# Patient Record
Sex: Female | Born: 1980 | Race: White | Hispanic: No | Marital: Married | Smoking: Never smoker
Health system: Southern US, Community
[De-identification: ages and names within clinical notes are randomized; demographics above are authoritative.]

## PROBLEM LIST (undated history)

## (undated) HISTORY — PX: WISDOM TOOTH EXTRACTION: SHX21

---

## 2014-06-01 ENCOUNTER — Encounter: Payer: Self-pay | Admitting: Obstetrics and Gynecology

## 2014-07-03 ENCOUNTER — Inpatient Hospital Stay: Payer: Self-pay

## 2014-07-03 LAB — CBC WITH DIFFERENTIAL/PLATELET
BASOS ABS: 0.1 10*3/uL (ref 0.0–0.1)
BASOS PCT: 0.9 %
Basophil #: 0 10*3/uL (ref 0.0–0.1)
Basophil %: 0.4 %
Eosinophil #: 0.1 10*3/uL (ref 0.0–0.7)
Eosinophil #: 0 10*3/uL (ref 0.0–0.7)
Eosinophil %: 0.7 %
Eosinophil %: 0.4 %
HCT: 33.1 % — AB (ref 35.0–47.0)
HCT: 32.4 % — ABNORMAL LOW (ref 35.0–47.0)
HGB: 10.8 g/dL — AB (ref 12.0–16.0)
HGB: 10.3 g/dL — ABNORMAL LOW (ref 12.0–16.0)
LYMPHS PCT: 17.2 %
Lymphocyte #: 1.1 10*3/uL (ref 1.0–3.6)
Lymphocyte #: 1.4 10*3/uL (ref 1.0–3.6)
Lymphocyte %: 15.2 %
MCH: 28 pg (ref 26.0–34.0)
MCH: 27.6 pg (ref 26.0–34.0)
MCHC: 32.5 g/dL (ref 32.0–36.0)
MCHC: 31.9 g/dL — ABNORMAL LOW (ref 32.0–36.0)
MCV: 86 fL (ref 80–100)
MCV: 86 fL (ref 80–100)
MONOS PCT: 5.2 %
Monocyte #: 0.4 x10 3/mm (ref 0.2–0.9)
Monocyte #: 0.4 x10 3/mm (ref 0.2–0.9)
Monocyte %: 5.8 %
NEUTROS ABS: 6.1 10*3/uL (ref 1.4–6.5)
Neutrophil #: 5.6 10*3/uL (ref 1.4–6.5)
Neutrophil %: 77.9 %
Neutrophil %: 76.3 %
Platelet: 99 10*3/uL — ABNORMAL LOW (ref 150–440)
Platelet: 115 10*3/uL — ABNORMAL LOW (ref 150–440)
RBC: 3.85 10*6/uL (ref 3.80–5.20)
RBC: 3.75 10*6/uL — ABNORMAL LOW (ref 3.80–5.20)
RDW: 13.1 % (ref 11.5–14.5)
RDW: 13.5 % (ref 11.5–14.5)
WBC: 7.2 10*3/uL (ref 3.6–11.0)
WBC: 7.9 10*3/uL (ref 3.6–11.0)

## 2014-07-04 LAB — HEMATOCRIT: HCT: 29 % — ABNORMAL LOW (ref 35.0–47.0)

## 2015-01-20 NOTE — Op Note (Signed)
PATIENT NAME:  Lydia Munoz, Lydia Munoz MR#:  [id number] DATE OF BIRTH:  02-18-1981  DATE OF PROCEDURE:  07/03/2014  PREOPERATIVE DIAGNOSES:  1.  Intrauterine pregnancy at 36 weeks and 6 days. 2.  Preterm premature rupture of membranes. 3.  Preterm labor. 4.  Desire for repeat cesarean section.  5.  Gestational thrombocytopenia, platelets 115,000.  POSTOPERATIVE DIAGNOSES:  1.  Intrauterine pregnancy at 36 weeks and 6 days. 2.  Preterm premature rupture of membranes. 3.  Preterm labor. 4.  Desire for repeat cesarean section.  5.  Gestational thrombocytopenia, platelets 115,000.  PROCEDURE PERFORMED: Repeat low transverse cesarean section via Pfannenstiel skin incision with double layer uterine closure.  SURGEON: Morenci Bing, MD  ASSISTANT: Marta Antu, CNM  ANESTHESIA: Spinal.  INTRAVENOUS FLUIDS: 700 mL Crystalloid.  ESTIMATED BLOOD LOSS: 800 mL.  ANTIBIOTICS: 2 grams Ancef given preoperatively.  URINE OUTPUT: 100 mL via indwelling Foley catheter.  COMPLICATIONS: None.  SPECIMENS: None.  DISPOSITION: Stable to PAC-U.  FINDINGS: No intraabdominal adhesions were noted, although there was some dense scarring of the tissue from the subcutaneous layer down to the rectus fascia, female infant in cephalic presentation with loose nuchal cord x2 (reduced) and clear amniotic fluid. Birth weight 2,790 grams, Apgars 9 and 9, intact placenta with 3 vessel cord. Grossly normal uterus, tubes and ovaries bilaterally.   DESCRIPTION OF PROCEDURE: The patient was taken to the operating room where anesthesia was administered. She was then prepped and draped in normal sterile fashion, in dorsal supine position with a leftward tilt. A Pfannenstiel skin incision was made with the knife and carried down to the underlying rectus fascia and the fascia was then incised in the midline and the incision was extended laterally with the Mayo scissors. Attention was then turned to the superior aspect of the  fascial incision, which was grasped with Kocher clamps x2, tented up and dissected off the rectus muscle. In a similar fashion, the inferior aspect of the rectus fascia was dissected off the rectus muscles with the Mayo scissors. The rectus muscles were then separated in the midline with the help of electrocautery to help with some adhesions in between the rectus muscles and the peritoneum was entered bluntly. The bladder blade was inserted and vesicouterine peritoneum was identified, tented up, and entered with the Metzenbaum scissors. The incision was then extended laterally, and the bladder flap was created digitally, and the bladder blade was then reinserted. A low transverse hysterotomy was made with a scalpel. The endometrial cavity was breached yielding clear amniotic fluid. The incision was then extended bluntly and the infant's head was delivered atraumatically and the nuchal cord easily reduced. The remainder of the body was then delivered atraumatically and the cord was clamped x2 and cut and the infant was handed to the awaiting pediatricians. The placenta was then manually expressed and the uterus was exteriorized and cleared of all clots and debris. The hysterotomy was repaired with a running suture of 1-0 Vicryl and a second imbricating layer of 1-0 Vicryl was then placed for excellent hemostasis. The uterus and adnexa were then returned to the abdomen and hysterotomy was re-inspected. Excellent hemostasis was noted after irrigation of the abdomen. The peritoneum was then reapproximated with 3-0 Vicryl in a simple running fashion and the fascia was reapproximated with 0 Vicryl in simple running fashion. The subcutaneous layer was then closed with interrupted sutures of 3-0 Vicryl and the skin was then closed with 4-0 Vicryl. The patient tolerated the procedure well. Sponge, lap, needle and  instrument counts were correct x2, and the patient was transferred to the recovery room awake, alert, breathing  independently in stable condition.   ____________________________ [place] Bingharlie Jordyan Hardiman, MD cp:sb D: 07/04/2014 07:14:00 ET T: 07/04/2014 08:00:28 ET JOB#: [id number]  cc: [place] Bingharlie Kareena Arrambide, MD, <Dictator> [place] BingHARLIE Onda Kattner MD ELECTRONICALLY SIGNED 07/06/2014 16:07

## 2015-02-06 NOTE — H&P (Signed)
L&D Evaluation:  History Expanded:  HPI 34 yo G3 P1011 with EDD of 08/01/14 per LMP (8 wk US gave EDD of 10/27). Pt presents at 2840w6d with PROM this am. Evaluation at office confirmed rupture with + fern, nitrizine and pooling. Pt reports only mild ctx. Denies VB or decreased FM. [facility], early entry to care, CP cysts seen on US, not seen at repeat US at 31 weeks, growth at that time 51%. H/o LTCS for 2nd stage arrest of 7# fetus in 2012, ectopic pregnancy treated with Methotrexate.   Blood Type (Maternal) A positive   Group B Strep Results Maternal (Result >5wks must be treated as unknown) unknown/result > 5 weeks ago   Maternal HIV Negative   Maternal Syphilis Ab Nonreactive   Maternal Varicella Immune   Rubella Results (Maternal) immune   Maternal T-Dap Unknown   Patient's Medical History No Chronic Illness  prior ectopic   Patient's Surgical History Previous C-Section   Medications Pre Natal Vitamins   Allergies PCN, (rash)   Social History none   ROS:  ROS see HPI   Exam:  Vital Signs stable   General no apparent distress   Mental Status clear   Chest clear   Heart normal sinus rhythm   Abdomen gravid, non-tender   Pelvic 3/75/-2, unchanged from exam 2 hrs prior at office   Mebranes Ruptured   FHT normal rate with no decels, category 1 (questionable variable vs inaccurate tracing d/t pt's position)   Ucx irregular   Impression:  Impression IUP at 6840w6d with PPROM, prior c-section   Plan:  Comments Plan for repeat C-section at approx 1700 tonight (pt last ate at 0900). Keep NPO. Discussed possibility of VBAC with pt, which pt declines.  Start Ancef IV given unknown GBS status.  Dr Vergie LivingPickens aware and in agreement with plan Neonatology has already consulted with pt.   Electronic Signatures: Vella KohlerBrothers, Rockie Schnoor K (CNM)  (Signed 05-Oct-15 12:16)  Authored: L&D Evaluation   Last Updated: 05-Oct-15 12:16 by Vella KohlerBrothers, Malan Werk K (CNM)

## 2016-06-05 ENCOUNTER — Ambulatory Visit (INDEPENDENT_AMBULATORY_CARE_PROVIDER_SITE_OTHER): Payer: BLUE CROSS/BLUE SHIELD | Admitting: Internal Medicine

## 2016-06-05 ENCOUNTER — Encounter: Payer: Self-pay | Admitting: Internal Medicine

## 2016-06-05 VITALS — BP 106/68 | HR 89 | Temp 98.7°F | Wt 139.0 lb

## 2016-06-05 DIAGNOSIS — M7551 Bursitis of right shoulder: Secondary | ICD-10-CM

## 2016-06-05 DIAGNOSIS — J0110 Acute frontal sinusitis, unspecified: Secondary | ICD-10-CM

## 2016-06-05 MED ORDER — CLARITHROMYCIN 500 MG PO TABS: 500.0000 mg | ORAL_TABLET | Freq: Two times a day (BID) | ORAL | 0 refills | Status: DC

## 2016-06-05 NOTE — Patient Instructions (Signed)

## 2016-06-05 NOTE — Progress Notes (Signed)
HPI  Pt presents to the clinic today to establish care. She has not had a PCP in many years, but has been following with GYN.  She reports on 8/21,she went to UC because she broke out in hives. They gave her a steroid shot and started her on Prednisone which she took for 6 days with resolution of her hives. But during that time, she started to develop sinus issues: headache, facial pain and pressure, nasal congestion, post nasal drip and cough. She went back to UC on 05/25/16, they diagnosed her with sinusitis, put her Azithromycin and Flonase. Since that time, she reports she has not felt any better. She has ongoing daily sinus headaches, facial pain and pressure. She is not able to blow anything out of her nose. She denies sore throat. Her cough is non productive. She does not know if she has run fevers, but she reports she feels awful. She has no history of allergies that she is aware of and she has not had sick contacts. She has tried Sudafed in addition to the medications prescribed without any relief.  She also c/o pain her her right shoulder. This started a few days ago. She describes the pain as sharp and stabbing. The pain seems worse when she lays on her shoulder or raises her arm above her head. The pain radiates up her shoulder. She denies numbness, tingling or weakness. She denies any injury to the area. She has not taken anything OTC for this.  Flu: 06/2015 Tetanus: 2015 [facility] Vision screening: as needed Dentist: biannually  History reviewed. No pertinent past medical history.  Current Outpatient Prescriptions  Medication Sig Dispense Refill  . azithromycin (ZITHROMAX) 250 MG tablet Take by mouth daily.    . fluticasone (FLONASE) 50 MCG/ACT nasal spray USE 1-2 SPRAYS IN EACH NOSTRIL EVERY DAY  0  . levonorgestrel (MIRENA) 20 MCG/24HR IUD 1 each by Intrauterine route once. Inserted 2015    . clarithromycin (BIAXIN) 500 MG  tablet Take 1 tablet (500 mg total) by mouth 2 (two) times daily. 20 tablet 0   No current facility-administered medications for this visit.     Allergies  Allergen Reactions  . Penicillins Hives and Diarrhea    Family History  Problem Relation Age of Onset  . Hyperlipidemia Mother   . Colon cancer Maternal Grandmother   . Hyperlipidemia Maternal Grandmother     Social History   Social History  . Marital status: Married    Spouse name: N/A  . Number of children: N/A  . Years of education: N/A   Occupational History  . Not on file.   Social History Main Topics  . Smoking status: Never Smoker  . Smokeless tobacco: Never Used  . Alcohol use Yes     Comment: moderate  . Drug use: Unknown  . Sexual activity: Yes   Other Topics Concern  . Not on file   Social History Narrative  . No narrative on file    ROS:  Constitutional: Pt reports headache. Denies fever, malaise, fatigue, or abrupt weight changes.  HEENT: Pt reports nasal congestion. Denies eye pain, eye redness, ear pain, ringing in the ears, wax buildup, runny nose, bloody nose, or sore throat. Respiratory: Pt reports cough. Denies difficulty breathing, shortness of breath, or sputum production.   Cardiovascular: Denies chest pain, chest tightness, palpitations or swelling in the hands or feet.  Gastrointestinal: Denies abdominal pain, bloating, constipation, diarrhea or blood  in the stool.  GU: Denies frequency, urgency, pain with urination, blood in urine, odor or discharge. Musculoskeletal: Pt reports right shoulder pain. Denies decrease in range of motion, difficulty with gait, muscle pain or joint swelling.  Skin: Denies redness, rashes, lesions or ulcercations.  Neurological: Denies dizziness, difficulty with memory, difficulty with speech or problems with balance and coordination.  Psych: Denies anxiety, depression, SI/HI.  No other specific complaints in a complete review of systems (except as listed  in HPI above).  PE:  BP 106/68   Pulse 89   Temp 98.7 F (37.1 C) (Oral)   Wt 139 lb (63 kg)   LMP  (LMP Unknown) Comment: IUD  SpO2 98%  Wt Readings from Last 3 Encounters:  06/05/16 139 lb (63 kg)    General: Appears her stated age, well developed, well nourished in NAD. HEENT: Head: normal shape and size, frontal sinus tenderness noted; Eyes: sclera white, no icterus, conjunctiva pink, PERRLA and EOMs intact; Ears: Tm's gray and intact, normal light reflex, + serous effusion bilaterally;Throat/Mouth: Teeth present, mucosa pink and moist, no lesions or ulcerations noted.  Neck: No adenopathy noted.  Cardiovascular: Normal rate and rhythm. S1,S2 noted.  No murmur, rubs or gallops noted.  Pulmonary/Chest: Normal effort and positive vesicular breath sounds. No respiratory distress. No wheezes, rales or ronchi noted.  MSK: Normal internal and external rotation of the shoulders. Pain with palpation of the right subacromial bursa. Strength 5/5 BUE. Negative drop can  Test. Neurological: Alert and oriented.  Sensation intact to BUE. Psychiatric: Mood and affect normal. Behavior is normal. Judgment and thought content normal.     Assessment and Plan:  Acute Frontal Sinusitis:  Continue Flonase Stop Erythromycin eRx for Biaxin 500 mg BID x 10 days If symptoms persist, may warrant referral to ENT  Subacromial bursitis, right:  Try 600-800 mg of Ibuprofen every 8 hours prn Ice may be helpful Avoid repetitive motions

## 2016-12-04 ENCOUNTER — Ambulatory Visit: Payer: Self-pay | Admitting: Medical

## 2017-02-10 ENCOUNTER — Telehealth: Payer: Self-pay | Admitting: *Deleted

## 2017-02-10 MED ORDER — OSELTAMIVIR PHOSPHATE 75 MG PO CAPS: 75.0000 mg | ORAL_CAPSULE | Freq: Two times a day (BID) | ORAL | 0 refills | Status: DC

## 2017-02-10 NOTE — Telephone Encounter (Signed)
Patient left a voicemail stating that her husband has been diagnosed with the flu.  Patient stated that she now has developed flu like symptoms and would like Tamiflu sent in. Pharmacy- [facility]

## 2017-02-10 NOTE — Telephone Encounter (Signed)
Tamiflu sent to [facility]

## 2017-02-10 NOTE — Addendum Note (Signed)
Addended by: Lorre MunroeBAITY, Suad Autrey W on: 02/10/2017 12:58 PM   Modules accepted: Orders

## 2017-12-13 DIAGNOSIS — S52502A Unspecified fracture of the lower end of left radius, initial encounter for closed fracture: Secondary | ICD-10-CM | POA: Insufficient documentation

## 2017-12-13 HISTORY — DX: Unspecified fracture of the lower end of left radius, initial encounter for closed fracture: S52.502A

## 2018-01-01 DIAGNOSIS — S52572D Other intraarticular fracture of lower end of left radius, subsequent encounter for closed fracture with routine healing: Secondary | ICD-10-CM | POA: Diagnosis not present

## 2018-01-01 DIAGNOSIS — Y33XXXD Other specified events, undetermined intent, subsequent encounter: Secondary | ICD-10-CM | POA: Diagnosis not present

## 2018-01-01 DIAGNOSIS — S52592D Other fractures of lower end of left radius, subsequent encounter for closed fracture with routine healing: Secondary | ICD-10-CM | POA: Diagnosis not present

## 2018-01-14 ENCOUNTER — Ambulatory Visit: Payer: Self-pay | Admitting: Adult Health

## 2018-01-14 ENCOUNTER — Encounter: Payer: Self-pay | Admitting: Adult Health

## 2018-01-14 VITALS — BP 119/80 | HR 71 | Temp 98.7°F | Resp 16 | Ht 65.0 in | Wt 144.0 lb

## 2018-01-14 DIAGNOSIS — J309 Allergic rhinitis, unspecified: Secondary | ICD-10-CM

## 2018-01-14 DIAGNOSIS — J3489 Other specified disorders of nose and nasal sinuses: Secondary | ICD-10-CM

## 2018-01-14 MED ORDER — AZITHROMYCIN 250 MG PO TABS
ORAL_TABLET | ORAL | 0 refills | Status: DC
Start: 2018-01-14 — End: 2019-08-24

## 2018-01-14 NOTE — Progress Notes (Signed)
Subjective:    Patient ID: Lydia Munoz, female    DOB: Sep 19, 1981, 37 y.o.   MRN: [id number]   Patient is a 37 year old female in no acute distress who reports symptoms as below.   Allergies  Allergen Reactions  . Penicillins Hives and Diarrhea   Patient Active Problem List   Diagnosis Date Noted  . Closed fracture of left distal radius 12/13/2017    Flonase daily - she is using. She is not taking a antihistamine.    Sinusitis  This is a new (2 weeks ) problem. The current episode started 1 to 4 weeks ago. The problem is unchanged. Associated symptoms include congestion, coughing, headaches (intermittent - mild ), sinus pressure (maxillary - mild per patient. ), sneezing, a sore throat and swollen glands. Pertinent negatives include no chills, diaphoresis, ear pain, hoarse voice (two days ago resolved ), neck pain or shortness of breath. Past treatments include oral decongestants (Flonase ). The treatment provided mild relief.      Review of Systems  Constitutional: Negative for chills and diaphoresis.  HENT: Positive for congestion, sinus pressure (maxillary - mild per patient. ), sneezing and sore throat. Negative for dental problem, drooling, ear discharge, ear pain, facial swelling, hearing loss, hoarse voice (two days ago resolved ), mouth sores, nosebleeds, postnasal drip, rhinorrhea, sinus pain, tinnitus and trouble swallowing.   Eyes: Negative.   Respiratory: Positive for cough. Negative for apnea, choking, chest tightness, shortness of breath, wheezing and stridor.   Cardiovascular: Negative.   Gastrointestinal: Negative.   Endocrine: Negative.   Genitourinary: Negative.   Musculoskeletal: Negative for arthralgias, back pain, gait problem, joint swelling, myalgias, neck pain and neck stiffness.  Neurological: Positive for headaches (intermittent - mild ).       Objective:   Physical Exam  Constitutional: She is oriented to person, place, and time. Vital signs are  normal. She appears well-developed and well-nourished. She is active.  Non-toxic appearance. She does not have a sickly appearance. She does not appear ill. No distress.  Patient is alert and oriented and responsive to questions Engages in eye contact with provider. Speaks in full sentences without any pauses without any shortness of breath.   Patient moves on and off of exam table and in room without difficulty. Gait is normal in hall and in room. Patient is oriented to person place time and situation. Patient answers questions appropriately and engages in conversation.   HENT:  Head: Normocephalic and atraumatic.  Right Ear: Hearing, external ear and ear canal normal. Tympanic membrane is not perforated and not erythematous. A middle ear effusion is present.  Left Ear: Hearing, external ear and ear canal normal. Tympanic membrane is not perforated and not erythematous. A middle ear effusion is present.  Nose: Rhinorrhea present. No mucosal edema. Right sinus exhibits no maxillary sinus tenderness and no frontal sinus tenderness. Left sinus exhibits no maxillary sinus tenderness and no frontal sinus tenderness.  Mouth/Throat: Uvula is midline. No uvula swelling. Posterior oropharyngeal erythema (post nasal drip visualized - light yellow in color ) present. No oropharyngeal exudate, posterior oropharyngeal edema or tonsillar abscesses.   Cobblestoning posterior pharynx; bilateral allergic shiners; bilateral TMs air fluid level clear; bilateral nasal turbinates mild edema erythema clear discharge;     Eyes: Pupils are equal, round, and reactive to light. Conjunctivae and EOM are normal. Right eye exhibits no discharge. Left eye exhibits no discharge. No scleral icterus.  Neck: Normal range of motion. Neck supple. No JVD present.  No tracheal deviation present.  Cardiovascular: Normal rate, regular rhythm, normal heart sounds and intact distal pulses. Exam reveals no gallop and no friction rub.  No  murmur heard. Pulmonary/Chest: Effort normal and breath sounds normal. No stridor. No respiratory distress. She has no wheezes. She has no rales. She exhibits no tenderness.  Abdominal: Soft. Bowel sounds are normal.  Musculoskeletal: Normal range of motion.  Lymphadenopathy:       Head (right side): No submental, no submandibular, no tonsillar, no preauricular, no posterior auricular and no occipital adenopathy present.       Head (left side): No submental, no submandibular, no tonsillar, no preauricular, no posterior auricular and no occipital adenopathy present.    She has no cervical adenopathy.  No lymphadenopathy of head or cervical found on exam.   Neurological: She is alert and oriented to person, place, and time. She displays normal reflexes. No cranial nerve deficit. She exhibits normal muscle tone. Coordination normal.  Skin: Skin is warm and dry. No rash noted. She is not diaphoretic. No erythema. No pallor.  Psychiatric: She has a normal mood and affect. Her behavior is normal. Judgment and thought content normal.  Vitals reviewed.       Assessment & Plan:   Meds ordered this encounter  Medications  . azithromycin (ZITHROMAX) 250 MG tablet    Sig: By mouth Take 2 tablets day 1 (500mg  total) and 1 tablet ( 250 mg ) days 2,3,4,5.    Dispense:  6 tablet    Refill:  0   Start antihistamine such as Allegra, Claritin or Zyrtec. Choose one and take per package instructions. Continue Flonase. Discussed allergy symptoms and AVS handout given and reviewed.   Advised patient call the office or your primary care doctor for an appointment if no improvement within 72 hours or if any symptoms change or worsen at any time  Advised ER or urgent Care if after hours or on weekend. Call 911 for emergency symptoms at any time.Patinet verbalized understanding of all instructions given/reviewed and treatment plan and has no further questions or concerns at this time.    Patient verbalized  understanding of all instructions given and denies any further questions at this time.

## 2018-01-14 NOTE — Patient Instructions (Signed)

## 2018-02-03 DIAGNOSIS — Z4789 Encounter for other orthopedic aftercare: Secondary | ICD-10-CM | POA: Diagnosis not present

## 2018-02-03 DIAGNOSIS — Y33XXXD Other specified events, undetermined intent, subsequent encounter: Secondary | ICD-10-CM | POA: Diagnosis not present

## 2018-02-03 DIAGNOSIS — S52612D Displaced fracture of left ulna styloid process, subsequent encounter for closed fracture with routine healing: Secondary | ICD-10-CM | POA: Diagnosis not present

## 2018-02-03 DIAGNOSIS — S52572D Other intraarticular fracture of lower end of left radius, subsequent encounter for closed fracture with routine healing: Secondary | ICD-10-CM | POA: Diagnosis not present

## 2018-02-03 DIAGNOSIS — S52572S Other intraarticular fracture of lower end of left radius, sequela: Secondary | ICD-10-CM | POA: Diagnosis not present

## 2018-02-17 DIAGNOSIS — Z4789 Encounter for other orthopedic aftercare: Secondary | ICD-10-CM | POA: Diagnosis not present

## 2018-02-17 DIAGNOSIS — S52572D Other intraarticular fracture of lower end of left radius, subsequent encounter for closed fracture with routine healing: Secondary | ICD-10-CM | POA: Diagnosis not present

## 2018-02-17 DIAGNOSIS — S52572S Other intraarticular fracture of lower end of left radius, sequela: Secondary | ICD-10-CM | POA: Diagnosis not present

## 2018-03-03 DIAGNOSIS — R208 Other disturbances of skin sensation: Secondary | ICD-10-CM | POA: Diagnosis not present

## 2018-03-03 DIAGNOSIS — D485 Neoplasm of uncertain behavior of skin: Secondary | ICD-10-CM | POA: Diagnosis not present

## 2018-03-03 DIAGNOSIS — D2361 Other benign neoplasm of skin of right upper limb, including shoulder: Secondary | ICD-10-CM | POA: Diagnosis not present

## 2018-03-03 DIAGNOSIS — L538 Other specified erythematous conditions: Secondary | ICD-10-CM | POA: Diagnosis not present

## 2018-03-03 DIAGNOSIS — D2371 Other benign neoplasm of skin of right lower limb, including hip: Secondary | ICD-10-CM | POA: Diagnosis not present

## 2018-03-03 DIAGNOSIS — D2261 Melanocytic nevi of right upper limb, including shoulder: Secondary | ICD-10-CM | POA: Diagnosis not present

## 2018-03-17 DIAGNOSIS — Y33XXXD Other specified events, undetermined intent, subsequent encounter: Secondary | ICD-10-CM | POA: Diagnosis not present

## 2018-03-17 DIAGNOSIS — S52572D Other intraarticular fracture of lower end of left radius, subsequent encounter for closed fracture with routine healing: Secondary | ICD-10-CM | POA: Diagnosis not present

## 2018-06-18 DIAGNOSIS — S52572D Other intraarticular fracture of lower end of left radius, subsequent encounter for closed fracture with routine healing: Secondary | ICD-10-CM | POA: Diagnosis not present

## 2018-06-18 DIAGNOSIS — Y33XXXD Other specified events, undetermined intent, subsequent encounter: Secondary | ICD-10-CM | POA: Diagnosis not present

## 2019-06-30 ENCOUNTER — Telehealth: Payer: Self-pay | Admitting: Obstetrics and Gynecology

## 2019-06-30 ENCOUNTER — Other Ambulatory Visit: Payer: Self-pay

## 2019-06-30 ENCOUNTER — Ambulatory Visit: Payer: Self-pay

## 2019-06-30 DIAGNOSIS — Z23 Encounter for immunization: Secondary | ICD-10-CM

## 2019-06-30 NOTE — Telephone Encounter (Signed)
Patient is schedule for Mirena replacement / ANNUAL 07/19/19 with [facility]

## 2019-07-01 NOTE — Telephone Encounter (Signed)
Noted. Will order to arrive by apt date/time. 

## 2019-07-19 ENCOUNTER — Other Ambulatory Visit (HOSPITAL_COMMUNITY)
Admission: RE | Admit: 2019-07-19 | Discharge: 2019-07-19 | Disposition: A | Discharge status: Home / Self Care | Payer: BLUE CROSS/BLUE SHIELD | Source: Ambulatory Visit | Attending: Obstetrics and Gynecology | Admitting: Obstetrics and Gynecology

## 2019-07-19 ENCOUNTER — Other Ambulatory Visit: Payer: Self-pay

## 2019-07-19 ENCOUNTER — Encounter: Payer: Self-pay | Admitting: Obstetrics and Gynecology

## 2019-07-19 ENCOUNTER — Ambulatory Visit (INDEPENDENT_AMBULATORY_CARE_PROVIDER_SITE_OTHER): Payer: BC Managed Care – PPO | Admitting: Obstetrics and Gynecology

## 2019-07-19 VITALS — BP 120/80 | Ht 65.0 in | Wt 153.0 lb

## 2019-07-19 DIAGNOSIS — Z131 Encounter for screening for diabetes mellitus: Secondary | ICD-10-CM

## 2019-07-19 DIAGNOSIS — Z130 Encounter for screening for diseases of the blood and blood-forming organs and certain disorders involving the immune mechanism: Secondary | ICD-10-CM

## 2019-07-19 DIAGNOSIS — Z124 Encounter for screening for malignant neoplasm of cervix: Secondary | ICD-10-CM | POA: Insufficient documentation

## 2019-07-19 DIAGNOSIS — Z01419 Encounter for gynecological examination (general) (routine) without abnormal findings: Secondary | ICD-10-CM

## 2019-07-19 DIAGNOSIS — Z30433 Encounter for removal and reinsertion of intrauterine contraceptive device: Secondary | ICD-10-CM | POA: Diagnosis not present

## 2019-07-19 DIAGNOSIS — Z13220 Encounter for screening for lipoid disorders: Secondary | ICD-10-CM

## 2019-07-19 DIAGNOSIS — Z1329 Encounter for screening for other suspected endocrine disorder: Secondary | ICD-10-CM

## 2019-07-19 DIAGNOSIS — Z0000 Encounter for general adult medical examination without abnormal findings: Secondary | ICD-10-CM

## 2019-07-19 MED ORDER — LEVONORGESTREL 20 MCG/24HR IU IUD: INTRAUTERINE_SYSTEM | Freq: Once | INTRAUTERINE | Status: AC

## 2019-07-19 NOTE — Progress Notes (Signed)
Gynecology Annual Exam   PCP: Lorre Munroe, NP  Chief Complaint:  Chief Complaint  Patient presents with  . Gynecologic Exam  . Contraception    History of Present Illness: Patient is a 38 y.o. [id number] presents for annual exam. The patient has no complaints today.   LMP: No LMP recorded. (Menstrual status: IUD). She does not have uterine bleeding with the IUD in place.  The patient is sexually active. She currently uses IUD for contraception. She denies dyspareunia.  The patient does not perform self breast exams.  There is no notable family history of breast or ovarian cancer in her family.  The patient has regular exercise: yes.    The patient denies current symptoms of depression.    Review of Systems: ROS  Past Medical History:  History reviewed. No pertinent past medical history.  Past Surgical History:  History reviewed. No pertinent surgical history.  Gynecologic History:  No LMP recorded. (Menstrual status: IUD). Contraception: IUD Last Pap: Results were: unknonw   Obstetric History: [id number]  Family History:  Family History  Problem Relation Age of Onset  . Hyperlipidemia Mother   . Colon cancer Maternal Grandmother   . Hyperlipidemia Maternal Grandmother     Social History:  Social History   Socioeconomic History  . Marital status: Married    Spouse name: Not on file  . Number of children: Not on file  . Years of education: Not on file  . Highest education level: Not on file  Occupational History  . Not on file  Social Needs  . Financial resource strain: Not on file  . Food insecurity    Worry: Not on file    Inability: Not on file  . Transportation needs    Medical: Not on file    Non-medical: Not on file  Tobacco Use  . Smoking status: Never Smoker  . Smokeless tobacco: Never Used  Substance and Sexual Activity  . Alcohol use: Yes    Comment: moderate  . Drug use: Not on file  . Sexual activity: Yes  Lifestyle  . Physical  activity    Days per week: Not on file    Minutes per session: Not on file  . Stress: Not on file  Relationships  . Social Musician on phone: Not on file    Gets together: Not on file    Attends religious service: Not on file    Active member of club or organization: Not on file    Attends meetings of clubs or organizations: Not on file    Relationship status: Not on file  . Intimate partner violence    Fear of current or ex partner: Not on file    Emotionally abused: Not on file    Physically abused: Not on file    Forced sexual activity: Not on file  Other Topics Concern  . Not on file  Social History Narrative  . Not on file    Allergies:  Allergies  Allergen Reactions  . Penicillins Hives and Diarrhea    Medications: Prior to Admission medications   Medication Sig Start Date End Date Taking? Authorizing Provider  levonorgestrel (MIRENA) 20 MCG/24HR IUD 1 each by Intrauterine route once. Inserted 2015   Yes [provider]  azithromycin (ZITHROMAX) 250 MG tablet By mouth Take 2 tablets day 1 (500mg  total) and 1 tablet ( 250 mg ) days 2,3,4,5. Patient not taking: Reported on 07/19/2019 01/14/18   Flinchum,  Kelby Aline, FNP  fluticasone (FLONASE) 50 MCG/ACT nasal spray USE 1-2 SPRAYS IN EACH NOSTRIL EVERY DAY 05/29/16   [provider]    Physical Exam Vitals: Blood pressure 120/80, height 5\' 5"  (1.651 m), weight 153 lb (69.4 kg).  General: NAD HEENT: normocephalic, anicteric Thyroid: no enlargement, no palpable nodules Pulmonary: No increased work of breathing, CTAB Cardiovascular: RRR, distal pulses 2+ Breast: Breast symmetrical, no tenderness, no palpable nodules or masses, no skin or nipple retraction present, no nipple discharge.  No axillary or supraclavicular lymphadenopathy. Abdomen: NABS, soft, non-tender, non-distended.  Umbilicus without lesions.  No hepatomegaly, splenomegaly or masses palpable. No evidence of hernia   Genitourinary:  External: Normal external female genitalia.  Normal urethral meatus, normal Bartholin's and Skene's glands.    Vagina: Normal vaginal mucosa, no evidence of prolapse.    Cervix: Grossly normal in appearance, no bleeding  Uterus: Non-enlarged, mobile, normal contour.  No CMT  Adnexa: ovaries non-enlarged, no adnexal masses  Rectal: deferred  Lymphatic: no evidence of inguinal lymphadenopathy Extremities: no edema, erythema, or tenderness Neurologic: Grossly intact Psychiatric: mood appropriate, affect full  Female chaperone present for pelvic and breast  portions of the physical exam   IUD placed approximately 5 years ago. Since that time, she states that she has been happy with the IUD. She wishes to have her IUD removed and replaced with a new  IUD.   Discussed risks of irregular bleeding, cramping, infection, malpositioning or misplacement of the IUD outside the uterus which may require further procedure such as laparoscopy, risk of failure <1%. Time out was performed.   Patient identified, informed consent performed, consent signed.     A bimanual exam showed the uterus to be retroverted.  Speculum placed in the vagina.  Cervix visualized.    IUD Removal Strings of IUD identified and grasped.  IUD removed without problem.  Pt tolerated this well.  IUD noted to be intact.  IUD Placement Cleaned with Betadine x 2.  Grasped anteriorly with a single tooth tenaculum.  Uterus sounded to 7 cm.   IUD placed per manufacturer's recommendations. Difficult placement, bedside US used to correctly position the IUD.  Strings trimmed to 3 cm. Tenaculum was removed, good hemostasis noted.  Patient tolerated procedure well.   Patient was given post-procedure instructions. Patient was also asked to check IUD strings periodically and follow up in 4 weeks for IUD check.  Assessment: 38 y.o. [id number] routine annual exam  Plan: Problem List Items Addressed This Visit    None    Visit  Diagnoses    Cervical cancer screening    -  Primary   Relevant Orders   Cytology - PAP   Encounter for IUD removal and reinsertion       Relevant Medications   levonorgestrel (MIRENA) 20 MCG/24HR IUD      2) STI screening  was offered and declined  2)  ASCCP guidelines and rational discussed.  Patient opts for every 5 years screening interval  3) Contraception - the patient is currently using  IUD.  She is happy with her current form of contraception and plans to continue  4) Routine healthcare maintenance including cholesterol, diabetes screening discussed To return fasting at a later date  5) Return in about 4 weeks (around 08/16/2019) for IUD check.  Adrian Prows MD [facility], [facility] 07/19/2019 5:03 PM

## 2019-07-21 LAB — CYTOLOGY - PAP
Comment: NEGATIVE
Diagnosis: NEGATIVE
High risk HPV: NEGATIVE

## 2019-07-21 NOTE — Progress Notes (Signed)
NIL- released to mychart with note

## 2019-08-24 ENCOUNTER — Encounter: Payer: Self-pay | Admitting: Obstetrics and Gynecology

## 2019-08-24 ENCOUNTER — Other Ambulatory Visit: Payer: Self-pay

## 2019-08-24 ENCOUNTER — Ambulatory Visit (INDEPENDENT_AMBULATORY_CARE_PROVIDER_SITE_OTHER): Payer: BC Managed Care – PPO | Admitting: Obstetrics and Gynecology

## 2019-08-24 VITALS — BP 120/70 | Temp 97.5°F | Ht 65.0 in | Wt 155.0 lb

## 2019-08-24 DIAGNOSIS — Z30431 Encounter for routine checking of intrauterine contraceptive device: Secondary | ICD-10-CM

## 2019-08-24 NOTE — Progress Notes (Signed)
  History of Present Illness:  Lydia Munoz is a 38 y.o. that had a Mirena IUD placed approximately 1 month ago. Since that time, she states that she has had no pain  PMHx: She  has no past medical history on file. Also,  has no past surgical history on file., family history includes Colon cancer in her maternal grandmother; Hyperlipidemia in her maternal grandmother and mother.,  reports that she has never smoked. She has never used smokeless tobacco. She reports current alcohol use. Current Meds  Medication Sig  . fluticasone (FLONASE) 50 MCG/ACT nasal spray USE 1-2 SPRAYS IN EACH NOSTRIL EVERY DAY  . levonorgestrel (MIRENA) 20 MCG/24HR IUD 1 each by Intrauterine route once. Inserted 2015   Current Facility-Administered Medications for the 08/24/19 encounter (Office Visit) with Homero Fellers, MD  Medication  . levonorgestrel (MIRENA) 20 MCG/24HR IUD  .  Also, is allergic to penicillins..  Review of Systems  Constitutional: Negative for chills, fever, malaise/fatigue and weight loss.  HENT: Negative for congestion, hearing loss and sinus pain.   Eyes: Negative for blurred vision and double vision.  Respiratory: Negative for cough, sputum production, shortness of breath and wheezing.   Cardiovascular: Negative for chest pain, palpitations, orthopnea and leg swelling.  Gastrointestinal: Negative for abdominal pain, constipation, diarrhea, nausea and vomiting.  Genitourinary: Negative for dysuria, flank pain, frequency, hematuria and urgency.  Musculoskeletal: Negative for back pain, falls and joint pain.  Skin: Negative for itching and rash.  Neurological: Negative for dizziness and headaches.  Psychiatric/Behavioral: Negative for depression, substance abuse and suicidal ideas. The patient is not nervous/anxious.     Physical Exam:  BP 120/70   Temp (!) 97.5 F (36.4 C)   Ht 5\' 5"  (1.651 m)   Wt 155 lb (70.3 kg)   BMI 25.79 kg/m  Body mass index is 25.79 kg/m.  Constitutional: Well nourished, well developed female in no acute distress.  Abdomen: diffusely non tender to palpation, non distended, and no masses, hernias Neuro: Grossly intact Psych:  Normal mood and affect.    Pelvic exam:  Two IUD strings present seen coming from the cervical os. EGBUS, vaginal vault and cervix: within normal limits  Assessment: IUD strings present in proper location; pt doing well  Plan: She was told to continue to use barrier contraception, in order to prevent any STIs, and to take a home pregnancy test or call us if she ever thinks she may be pregnant, and that her IUD expires in 6 years.  She was amenable to this plan and we will see her back in 1 year/PRN.  A total of 10 minutes were spent face-to-face with the patient during this encounter and over half of that time dealt with counseling and coordination of care.  Adrian Prows MD [facility], [facility] 08/24/2019 2:34 PM

## 2019-08-30 ENCOUNTER — Other Ambulatory Visit: Payer: Self-pay

## 2019-08-30 ENCOUNTER — Other Ambulatory Visit: Payer: BC Managed Care – PPO

## 2019-08-30 DIAGNOSIS — Z1329 Encounter for screening for other suspected endocrine disorder: Secondary | ICD-10-CM | POA: Diagnosis not present

## 2019-08-30 DIAGNOSIS — Z131 Encounter for screening for diabetes mellitus: Secondary | ICD-10-CM | POA: Diagnosis not present

## 2019-08-30 DIAGNOSIS — Z13220 Encounter for screening for lipoid disorders: Secondary | ICD-10-CM | POA: Diagnosis not present

## 2019-08-30 DIAGNOSIS — Z130 Encounter for screening for diseases of the blood and blood-forming organs and certain disorders involving the immune mechanism: Secondary | ICD-10-CM

## 2019-08-30 DIAGNOSIS — Z0000 Encounter for general adult medical examination without abnormal findings: Secondary | ICD-10-CM | POA: Diagnosis not present

## 2019-08-31 ENCOUNTER — Other Ambulatory Visit: Payer: Self-pay

## 2019-08-31 DIAGNOSIS — Z20822 Contact with and (suspected) exposure to covid-19: Secondary | ICD-10-CM

## 2019-08-31 LAB — T4, FREE: Free T4: 1.06 ng/dL (ref 0.82–1.77)

## 2019-08-31 LAB — BASIC METABOLIC PANEL
BUN/Creatinine Ratio: 12 (ref 9–23)
BUN: 10 mg/dL (ref 6–20)
CO2: 21 mmol/L (ref 20–29)
Calcium: 8.8 mg/dL (ref 8.7–10.2)
Chloride: 105 mmol/L (ref 96–106)
Creatinine, Ser: 0.82 mg/dL (ref 0.57–1.00)
GFR calc Af Amer: 105 mL/min/{1.73_m2} (ref >59)
GFR calc non Af Amer: 91 mL/min/{1.73_m2} (ref >59)
Glucose: 86 mg/dL (ref 65–99)
Potassium: 4.2 mmol/L (ref 3.5–5.2)
Sodium: 142 mmol/L (ref 134–144)

## 2019-08-31 LAB — CBC WITH DIFFERENTIAL
Basophils Absolute: 0.1 10*3/uL (ref 0.0–0.2)
Basos: 1 %
EOS (ABSOLUTE): 0.1 10*3/uL (ref 0.0–0.4)
Eos: 2 %
Hematocrit: 38.6 % (ref 34.0–46.6)
Hemoglobin: 13.5 g/dL (ref 11.1–15.9)
Immature Grans (Abs): 0 10*3/uL (ref 0.0–0.1)
Immature Granulocytes: 0 %
Lymphocytes Absolute: 1.3 10*3/uL (ref 0.7–3.1)
Lymphs: 35 %
MCH: 31.9 pg (ref 26.6–33.0)
MCHC: 35 g/dL (ref 31.5–35.7)
MCV: 91 fL (ref 79–97)
Monocytes Absolute: 0.2 10*3/uL (ref 0.1–0.9)
Monocytes: 6 %
Neutrophils Absolute: 2 10*3/uL (ref 1.4–7.0)
Neutrophils: 56 %
RBC: 4.23 x10E6/uL (ref 3.77–5.28)
RDW: 12.5 % (ref 11.7–15.4)
WBC: 3.7 10*3/uL (ref 3.4–10.8)

## 2019-08-31 LAB — LIPID PANEL
Chol/HDL Ratio: 2.6 ratio (ref 0.0–4.4)
Cholesterol, Total: 165 mg/dL (ref 100–199)
HDL: 64 mg/dL (ref >39)
LDL Chol Calc (NIH): 85 mg/dL (ref 0–99)
Triglycerides: 85 mg/dL (ref 0–149)
VLDL Cholesterol Cal: 16 mg/dL (ref 5–40)

## 2019-08-31 LAB — TSH: TSH: 2.23 u[IU]/mL (ref 0.450–4.500)

## 2019-08-31 NOTE — Progress Notes (Signed)
WNL

## 2019-09-02 LAB — NOVEL CORONAVIRUS, NAA: SARS-CoV-2, NAA: NOT DETECTED

## 2019-09-29 ENCOUNTER — Ambulatory Visit (INDEPENDENT_AMBULATORY_CARE_PROVIDER_SITE_OTHER)
Admission: RE | Admit: 2019-09-29 | Discharge: 2019-09-29 | Disposition: A | Discharge status: Home / Self Care | Payer: BC Managed Care – PPO | Source: Ambulatory Visit

## 2019-09-29 DIAGNOSIS — R21 Rash and other nonspecific skin eruption: Secondary | ICD-10-CM

## 2019-09-29 MED ORDER — PREDNISONE 10 MG (21) PO TBPK: ORAL_TABLET | Freq: Every day | ORAL | 0 refills | Status: DC

## 2019-09-29 NOTE — Discharge Instructions (Signed)
Take the prednisone as prescribed.  Additionally you can take Benadryl as needed for itching.    Follow-up with your primary care provider or come here to be seen in person if your symptoms are not improving.

## 2019-09-29 NOTE — ED Provider Notes (Signed)
Virtual Visit via Video Note:  Lydia Munoz  initiated request for Telemedicine visit with [facility]gent [facility] team. I connected with Lydia Munoz  on 09/29/2019 at 12:58 PM  for a synchronized telemedicine visit using a video enabled HIPPA compliant telemedicine application. I verified that I am speaking with Lydia Munoz  using two identifiers. Sharion Balloon, NP  was physically located in a [facility] Urgent care site and Shellee Streng was located at a different location.   The limitations of evaluation and management by telemedicine as well as the availability of in-person appointments were discussed. Patient was informed that she  may incur a bill ( including co-pay) for this virtual visit encounter. Lydia Munoz  expressed understanding and gave verbal consent to proceed with virtual visit.     History of Present Illness:Lydia Munoz  is a 38 y.o. female presents for evaluation of red, pruritic, nontender rash on abdomen x 5 days.  The rash is only on left side; she describes it as raised red bumps; no pustules or blisters. No immunosuppressive risk factors.  Treating at home with hydrocortisone cream.  No new products or medicines.  Denies fever, chills, sore throat, cough, shortness of breath, vomiting, diarrhea, or other symptoms.     Allergies  Allergen Reactions  . Penicillins Hives and Diarrhea     History reviewed. No pertinent past medical history.   Social History   Tobacco Use  . Smoking status: Never Smoker  . Smokeless tobacco: Never Used  Substance Use Topics  . Alcohol use: Yes    Comment: moderate  . Drug use: Not on file        Observations/Objective: Physical Exam  VITALS: Patient denies fever. GENERAL: Alert, appears well and in no acute distress. HEENT: Atraumatic. NECK: Normal movements of the head and neck. CARDIOPULMONARY: No increased WOB. Speaking in clear sentences. I:E ratio WNL.  MS: Moves all visible extremities without noticeable  abnormality. PSYCH: Pleasant and cooperative, well-groomed. Speech normal rate and rhythm. Affect is appropriate. Insight and judgement are appropriate. Attention is focused, linear, and appropriate.  NEURO: CN grossly intact. Oriented as arrived to appointment on time with no prompting. Moves both UE equally.  SKIN: Left lower abdomen has two clusters of red papules.  No apparent drainage.     Assessment and Plan:    ICD-10-CM   1. Rash and nonspecific skin eruption  R21        Follow Up Instructions: Rash is not consistent with shingles.  Treating with prednisone and Benadryl.  Instructed patient to follow-up with her PCP or come here to be seen in person if her symptoms are not improving.    I discussed the assessment and treatment plan with the patient. The patient was provided an opportunity to ask questions and all were answered. The patient agreed with the plan and demonstrated an understanding of the instructions.   The patient was advised to call back or seek an in-person evaluation if the symptoms worsen or if the condition fails to improve as anticipated.      Sharion Balloon, NP  09/29/2019 12:58 PM         Sharion Balloon, NP 09/29/19 [id number]

## 2019-12-02 ENCOUNTER — Ambulatory Visit: Payer: Self-pay | Attending: Internal Medicine

## 2019-12-02 DIAGNOSIS — Z23 Encounter for immunization: Secondary | ICD-10-CM | POA: Insufficient documentation

## 2019-12-02 NOTE — Progress Notes (Signed)
   Covid-19 Vaccination Clinic  Name:  Lydia Munoz    MRN: [id number] DOB: [birth year]  12/02/2019  Lydia Munoz was observed post Covid-19 immunization for 15 minutes without incident. She was provided with Vaccine Information Sheet and instruction to access the V-Safe system.   Lydia Munoz was instructed to call 911 with any severe reactions post vaccine: Marland Kitchen Difficulty breathing  . Swelling of face and throat  . A fast heartbeat  . A bad rash all over body  . Dizziness and weakness

## 2019-12-26 ENCOUNTER — Ambulatory Visit: Payer: Self-pay | Attending: Internal Medicine

## 2019-12-26 DIAGNOSIS — Z23 Encounter for immunization: Secondary | ICD-10-CM

## 2019-12-26 NOTE — Progress Notes (Signed)
   Covid-19 Vaccination Clinic  Name:  Lydia Munoz    MRN: [id number] DOB: Jun 21, 1981  12/26/2019  [name] was observed post Covid-19 immunization for 15 minutes without incident. She was provided with Vaccine Information Sheet and instruction to access the V-Safe system.   [name] was instructed to call 911 with any severe reactions post vaccine: Marland Kitchen Difficulty breathing  . Swelling of face and throat  . A fast heartbeat  . A bad rash all over body  . Dizziness and weakness   Immunizations Administered    Name Date Dose VIS Date Route   Pfizer COVID-19 Vaccine 12/26/2019 12:17 PM 0.3 mL 09/09/2019 Intramuscular   Manufacturer: [employer], [employer]   Lot: [id number]   NDC: [address]-2

## 2020-01-02 ENCOUNTER — Ambulatory Visit: Payer: Self-pay

## 2020-03-07 DIAGNOSIS — D2361 Other benign neoplasm of skin of right upper limb, including shoulder: Secondary | ICD-10-CM | POA: Diagnosis not present

## 2020-03-07 DIAGNOSIS — D235 Other benign neoplasm of skin of trunk: Secondary | ICD-10-CM | POA: Diagnosis not present

## 2020-03-07 DIAGNOSIS — L728 Other follicular cysts of the skin and subcutaneous tissue: Secondary | ICD-10-CM | POA: Diagnosis not present

## 2020-03-07 DIAGNOSIS — L918 Other hypertrophic disorders of the skin: Secondary | ICD-10-CM | POA: Diagnosis not present

## 2020-10-13 ENCOUNTER — Telehealth: Payer: Self-pay | Admitting: Nurse Practitioner

## 2020-10-13 DIAGNOSIS — J029 Acute pharyngitis, unspecified: Secondary | ICD-10-CM

## 2020-10-13 NOTE — Progress Notes (Signed)

## 2020-10-28 ENCOUNTER — Telehealth: Payer: BC Managed Care – PPO | Admitting: Oncology

## 2020-10-28 DIAGNOSIS — U071 COVID-19: Secondary | ICD-10-CM

## 2020-10-28 DIAGNOSIS — R059 Cough, unspecified: Secondary | ICD-10-CM

## 2020-10-28 MED ORDER — HYDROCOD POLST-CPM POLST ER 10-8 MG/5ML PO SUER: 5.0000 mL | Freq: Two times a day (BID) | ORAL | 0 refills | Status: DC

## 2020-10-28 MED ORDER — PREDNISONE 50 MG PO TABS: ORAL_TABLET | ORAL | 0 refills | Status: DC

## 2020-10-28 MED ORDER — AZITHROMYCIN 250 MG PO TABS: ORAL_TABLET | ORAL | 0 refills | Status: DC

## 2020-10-28 NOTE — Patient Instructions (Signed)
Person Under Monitoring Name: Lydia Munoz  Location: [address] [place] [address]   Infection Prevention Recommendations for Individuals Confirmed to have, or Being Evaluated for, 2019 Novel Coronavirus (COVID-19) Infection Who Receive Care at Home  Individuals who are confirmed to have, or are being evaluated for, COVID-19 should follow the prevention steps below until a healthcare provider or local or state health department says they can return to normal activities.  Stay home except to get medical care You should restrict activities outside your home, except for getting medical care. Do not go to work, school, or public areas, and do not use public transportation or taxis.  Call ahead before visiting your doctor Before your medical appointment, call the healthcare provider and tell them that you have, or are being evaluated for, COVID-19 infection. This will help the healthcare provider's office take steps to keep other people from getting infected. Ask your healthcare provider to call the local or state health department.  Monitor your symptoms Seek prompt medical attention if your illness is worsening (e.g., difficulty breathing). Before going to your medical appointment, call the healthcare provider and tell them that you have, or are being evaluated for, COVID-19 infection. Ask your healthcare provider to call the local or state health department.  Wear a facemask You should wear a facemask that covers your nose and mouth when you are in the same room with other people and when you visit a healthcare provider. People who live with or visit you should also wear a facemask while they are in the same room with you.  Separate yourself from other people in your home As much as possible, you should stay in a different room from other people in your home. Also, you should use a separate bathroom, if available.  Avoid sharing household items You should not share  dishes, drinking glasses, cups, eating utensils, towels, bedding, or other items with other people in your home. After using these items, you should wash them thoroughly with soap and water.  Cover your coughs and sneezes Cover your mouth and nose with a tissue when you cough or sneeze, or you can cough or sneeze into your sleeve. Throw used tissues in a lined trash can, and immediately wash your hands with soap and water for at least 20 seconds or use an alcohol-based hand rub.  Wash your [employer] your hands often and thoroughly with soap and water for at least 20 seconds. You can use an alcohol-based hand sanitizer if soap and water are not available and if your hands are not visibly dirty. Avoid touching your eyes, nose, and mouth with unwashed hands.   Prevention Steps for Caregivers and Household Members of Individuals Confirmed to have, or Being Evaluated for, COVID-19 Infection Being Cared for in the Home  If you live with, or provide care at home for, a person confirmed to have, or being evaluated for, COVID-19 infection please follow these guidelines to prevent infection:  Follow healthcare provider's instructions Make sure that you understand and can help the patient follow any healthcare provider instructions for all care.  Provide for the patient's basic needs You should help the patient with basic needs in the home and provide support for getting groceries, prescriptions, and other personal needs.  Monitor the patient's symptoms If they are getting sicker, call his or her medical provider and tell them that the patient has, or is being evaluated for, COVID-19 infection. This will help the healthcare provider's office  take steps to keep other people from getting infected. Ask the healthcare provider to call the local or state health department.  Limit the number of people who have contact with the patient  If possible, have only one caregiver for the patient.  Other  household members should stay in another home or place of residence. If this is not possible, they should stay  in another room, or be separated from the patient as much as possible. Use a separate bathroom, if available.  Restrict visitors who do not have an essential need to be in the home.  Keep older adults, very young children, and other sick people away from the patient Keep older adults, very young children, and those who have compromised immune systems or chronic health conditions away from the patient. This includes people with chronic heart, lung, or kidney conditions, diabetes, and cancer.  Ensure good ventilation Make sure that shared spaces in the home have good air flow, such as from an air conditioner or an opened window, weather permitting.  Wash your hands often  Wash your hands often and thoroughly with soap and water for at least 20 seconds. You can use an alcohol based hand sanitizer if soap and water are not available and if your hands are not visibly dirty.  Avoid touching your eyes, nose, and mouth with unwashed hands.  Use disposable paper towels to dry your hands. If not available, use dedicated cloth towels and replace them when they become wet.  Wear a facemask and gloves  Wear a disposable facemask at all times in the room and gloves when you touch or have contact with the patient's blood, body fluids, and/or secretions or excretions, such as sweat, saliva, sputum, nasal mucus, vomit, urine, or feces.  Ensure the mask fits over your nose and mouth tightly, and do not touch it during use.  Throw out disposable facemasks and gloves after using them. Do not reuse.  Wash your hands immediately after removing your facemask and gloves.  If your personal clothing becomes contaminated, carefully remove clothing and launder. Wash your hands after handling contaminated clothing.  Place all used disposable facemasks, gloves, and other waste in a lined container before  disposing them with other household waste.  Remove gloves and wash your hands immediately after handling these items.  Do not share dishes, glasses, or other household items with the patient  Avoid sharing household items. You should not share dishes, drinking glasses, cups, eating utensils, towels, bedding, or other items with a patient who is confirmed to have, or being evaluated for, COVID-19 infection.  After the person uses these items, you should wash them thoroughly with soap and water.  Wash laundry thoroughly  Immediately remove and wash clothes or bedding that have blood, body fluids, and/or secretions or excretions, such as sweat, saliva, sputum, nasal mucus, vomit, urine, or feces, on them.  Wear gloves when handling laundry from the patient.  Read and follow directions on labels of laundry or clothing items and detergent. In general, wash and dry with the warmest temperatures recommended on the label.  Clean all areas the individual has used often  Clean all touchable surfaces, such as counters, tabletops, doorknobs, bathroom fixtures, toilets, phones, keyboards, tablets, and bedside tables, every day. Also, clean any surfaces that may have blood, body fluids, and/or secretions or excretions on them.  Wear gloves when cleaning surfaces the patient has come in contact with.  Use a diluted bleach solution (e.g., dilute bleach with 1 part  bleach and 10 parts water) or a household disinfectant with a label that says EPA-registered for coronaviruses. To make a bleach solution at home, add 1 tablespoon of bleach to 1 quart (4 cups) of water. For a larger supply, add  cup of bleach to 1 gallon (16 cups) of water.  Read labels of cleaning products and follow recommendations provided on product labels. Labels contain instructions for safe and effective use of the cleaning product including precautions you should take when applying the product, such as wearing gloves or eye protection  and making sure you have good ventilation during use of the product.  Remove gloves and wash hands immediately after cleaning.  Monitor yourself for signs and symptoms of illness Caregivers and household members are considered close contacts, should monitor their health, and will be asked to limit movement outside of the home to the extent possible. Follow the monitoring steps for close contacts listed on the symptom monitoring form.   ? If you have additional questions, contact your local health department or call the epidemiologist on call at [phone] (available 24/7). ? This guidance is subject to change. For the most up-to-date guidance from [facility], please refer to their website: [email or web address]

## 2020-10-28 NOTE — Progress Notes (Signed)
Lydia Munoz are scheduled for a virtual visit with your provider today.    Just as we do with appointments in the office, we must obtain your consent to participate.  Your consent will be active for this visit and any virtual visit you may have with one of our providers in the next 365 days.    If you have a MyChart account, I can also send a copy of this consent to you electronically.  All virtual visits are billed to your insurance company just like a traditional visit in the office.  As this is a virtual visit, video technology does not allow for your provider to perform a traditional examination.  This may limit your provider's ability to fully assess your condition.  If your provider identifies any concerns that need to be evaluated in person or the need to arrange testing such as labs, EKG, etc, we will make arrangements to do so.    Although advances in technology are sophisticated, we cannot ensure that it will always work on either your end or our end.  If the connection with a video visit is poor, we may have to switch to a telephone visit.  With either a video or telephone visit, we are not always able to ensure that we have a secure connection.   I need to obtain your verbal consent now.   Are you willing to proceed with your visit today?   Lydia Munoz has provided verbal consent on 10/28/2020 for a virtual visit (video or telephone).   Lydia Kaufmann, NP 10/28/2020  4:55 PM  Virtual Visit via Video Note  I connected with Lydia Munoz on 10/28/20 at  3:00 PM EST by a video enabled telemedicine application and verified that I am speaking with the correct person using two identifiers.  Location: Patient: Home Provider: Home Office    I discussed the limitations of evaluation and management by telemedicine and the availability of in person appointments. The patient expressed understanding and agreed to proceed.  History of Present Illness: Lydia Munoz is a 40 yo female no past  medical history who presents for Lydia Munoz virtual visit today for symptoms of COVID-19.  She was diagnosed on 10/24/20.  Symptoms started with fever, chills, sore throat and cough.  Cough has been progressively worsening over the past 4 days.  Cough is productive with thick yellow sputum.  She has tried Mucinex, nasal rinses and cough drops without relief.  She is having trouble sleeping due to the cough. She has not had a fever in over 2 days but is taking tylenol. Her husband is also positive but is feeling much better.    Observations/Objective: Review of Systems  Constitutional: Positive for chills, fever and malaise/fatigue. Negative for weight loss.  HENT: Positive for congestion, sinus pain and sore throat. Negative for ear pain and tinnitus.   Eyes: Negative.  Negative for blurred vision and double vision.  Respiratory: Positive for cough, sputum production and shortness of breath.   Cardiovascular: Negative.  Negative for chest pain, palpitations and leg swelling.  Gastrointestinal: Negative.  Negative for abdominal pain, constipation, diarrhea, nausea and vomiting.  Genitourinary: Negative for dysuria, frequency and urgency.  Musculoskeletal: Negative for back pain and falls.  Skin: Negative.  Negative for rash.  Neurological: Positive for weakness. Negative for headaches.  Endo/Heme/Allergies: Negative.  Does not bruise/bleed easily.  Psychiatric/Behavioral: Negative.  Negative for depression. The patient is not nervous/anxious and does not have insomnia.    Physical Exam Constitutional:  Appearance: Normal appearance.  HENT:     Nose: Congestion present.  Pulmonary:     Effort: No tachypnea.  Neurological:     Mental Status: She is alert and oriented to person, place, and time.    Assessment and Plan:  COVID-19 symptoms: -Tested positive on 10/24/20. -She has been vaccinated against COVID x2 no booster. -Appears to have worsening cough with increased sputum  production. -Recommend she continue Mucinex, Tylenol for fever. -Can trial Tussionex with codeine cough syrup at bedtime to help with sleep. -Given she has had fevers with yellow sputum production recommend Z-Pak x5 days and steroids 50 mg tabs daily.   Follow Up Instructions: -RTC if symptoms worsen or fail to improve. -Red flag symptoms discussed with patient.   I discussed the assessment and treatment plan with the patient. The patient was provided an opportunity to ask questions and all were answered. The patient agreed with the plan and demonstrated an understanding of the instructions.   The patient was advised to call back or seek an in-person evaluation if the symptoms worsen or if the condition fails to improve as anticipated.  I provided 10 minutes of face-to-face time during this encounter.   Lydia Kaufmann, NP

## 2020-11-07 ENCOUNTER — Encounter: Payer: Self-pay | Admitting: Oncology

## 2020-11-12 ENCOUNTER — Ambulatory Visit: Payer: BC Managed Care – PPO | Admitting: Nurse Practitioner

## 2020-11-12 ENCOUNTER — Other Ambulatory Visit: Payer: Self-pay

## 2020-11-12 VITALS — BP 118/75 | HR 76 | Temp 98.4°F | Resp 18

## 2020-11-12 DIAGNOSIS — R0981 Nasal congestion: Secondary | ICD-10-CM | POA: Insufficient documentation

## 2020-11-12 DIAGNOSIS — Z8616 Personal history of COVID-19: Secondary | ICD-10-CM | POA: Insufficient documentation

## 2020-11-12 MED ORDER — PREDNISONE 20 MG PO TABS: 20.0000 mg | ORAL_TABLET | Freq: Every day | ORAL | 0 refills | Status: AC

## 2020-11-12 NOTE — Progress Notes (Signed)
@Patient  ID: , female    DOB: [birth year], 40 y.o.   MRN: [id number]  Chief Complaint  Patient presents with  . Covid Positive    Tested positive on 10/24/20 -     Referring provider: 10/26/20, NP   HPI  Patient presents today for post COVID care clinic visit.  She states that she tested positive for Covid on 10/24/2020.  She did receive a round of azithromycin and prednisone.  She states that she is having ongoing sinus congestion and postnasal drip.  She is not currently on any antihistamines.  She does do sinus rinses daily.  She has tried stay active and well-hydrated. Denies f/c/s, n/v/d, hemoptysis, PND, chest pain or edema.      Allergies  Allergen Reactions  . Penicillins Hives and Diarrhea    Immunization History  Administered Date(s) Administered  . Influenza,inj,Quad PF,6+ Mos 06/30/2019  . Influenza-Unspecified 07/21/2017, 07/26/2018, 07/25/2020  . PFIZER(Purple Top)SARS-COV-2 Vaccination 12/02/2019, 12/26/2019    History reviewed. No pertinent past medical history.  Tobacco History: Social History   Tobacco Use  Smoking Status Never Smoker  Smokeless Tobacco Never Used   Counseling given: Yes   Outpatient Encounter Medications as of 11/12/2020  Medication Sig  . predniSONE (DELTASONE) 20 MG tablet Take 1 tablet (20 mg total) by mouth daily with breakfast for 5 days.  11/14/2020 azithromycin (ZITHROMAX) 250 MG tablet Take 2 tabs on day 1 and 1 tab days 2-6.  . chlorpheniramine-HYDROcodone (TUSSIONEX PENNKINETIC ER) 10-8 MG/5ML SUER Take 5 mLs by mouth 2 (two) times daily.  . fluticasone (FLONASE) 50 MCG/ACT nasal spray USE 1-2 SPRAYS IN EACH NOSTRIL EVERY DAY  . levonorgestrel (MIRENA) 20 MCG/24HR IUD 1 each by Intrauterine route once. Inserted 2015  . [DISCONTINUED] predniSONE (DELTASONE) 50 MG tablet Take one tablet per day for 6 days.  . [DISCONTINUED] predniSONE (STERAPRED UNI-PAK 21 TAB) 10 MG (21) TBPK tablet Take by mouth daily. Take 6  tabs by mouth daily  for 1 day, then 5 tabs for 1 day, then 4 tabs for 1 day, then 3 tabs for 1 day, 2 tabs for 1 day, then 1 tab by mouth daily for 1 day   Facility-Administered Encounter Medications as of 11/12/2020  Medication  . levonorgestrel (MIRENA) 20 MCG/24HR IUD     Review of Systems  Review of Systems  Constitutional: Negative.  Negative for fatigue and fever.  HENT: Positive for congestion, postnasal drip, sinus pressure and sinus pain.   Respiratory: Negative for cough and shortness of breath.   Cardiovascular: Negative.  Negative for chest pain, palpitations and leg swelling.  Gastrointestinal: Negative.   Allergic/Immunologic: Negative.   Neurological: Negative.   Psychiatric/Behavioral: Negative.        Physical Exam  BP 118/75   Pulse 76   Temp 98.4 F (36.9 C)   Resp 18   SpO2 99% Comment: RA  Wt Readings from Last 5 Encounters:  08/24/19 155 lb (70.3 kg)  07/19/19 153 lb (69.4 kg)  01/14/18 144 lb (65.3 kg)  06/05/16 139 lb (63 kg)     Physical Exam Vitals and nursing note reviewed.  Constitutional:      General: She is not in acute distress.    Appearance: She is well-developed and well-nourished.  Cardiovascular:     Rate and Rhythm: Normal rate and regular rhythm.  Pulmonary:     Effort: Pulmonary effort is normal.     Breath sounds: Normal breath sounds.  Musculoskeletal:  Right lower leg: No edema.     Left lower leg: No edema.  Neurological:     Mental Status: She is alert and oriented to person, place, and time.  Psychiatric:        Mood and Affect: Mood and affect and mood normal.        Behavior: Behavior normal.       Assessment & Plan:   History of COVID-19 Sinus congestion Post nasal drip:   Stay well hydrated  Stay active  Deep breathing exercises  May take tylenol or fever or pain  May take mucinex twice daily  Zyrtec daily  Flonase daily  Will order prednisone    Follow up:  Follow up if  needed      Ivonne Andrew, NP 11/12/2020

## 2020-11-12 NOTE — Assessment & Plan Note (Signed)
Sinus congestion Post nasal drip:   Stay well hydrated  Stay active  Deep breathing exercises  May take tylenol or fever or pain  May take mucinex twice daily  Zyrtec daily  Flonase daily  Will order prednisone    Follow up:  Follow up if needed

## 2020-11-12 NOTE — Patient Instructions (Signed)
Covid 19 Sinus congestion Post nasal drip:   Stay well hydrated  Stay active  Deep breathing exercises  May take tylenol or fever or pain  May take mucinex twice daily  Zyrtec daily  Flonase daily  Will order prednisone    Follow up:  Follow up if needed

## 2021-07-10 ENCOUNTER — Ambulatory Visit: Payer: Self-pay

## 2021-07-10 ENCOUNTER — Other Ambulatory Visit: Payer: Self-pay

## 2021-07-10 DIAGNOSIS — Z23 Encounter for immunization: Secondary | ICD-10-CM

## 2021-08-08 DIAGNOSIS — Z23 Encounter for immunization: Secondary | ICD-10-CM | POA: Diagnosis not present

## 2021-08-30 NOTE — Telephone Encounter (Signed)
Mirena rcvd/charged 07/19/2019

## 2021-10-24 ENCOUNTER — Encounter: Payer: Self-pay | Admitting: Internal Medicine

## 2021-10-24 ENCOUNTER — Other Ambulatory Visit: Payer: Self-pay | Admitting: Internal Medicine

## 2021-10-30 ENCOUNTER — Other Ambulatory Visit: Payer: Self-pay

## 2021-10-30 ENCOUNTER — Ambulatory Visit: Payer: BC Managed Care – PPO | Admitting: Internal Medicine

## 2021-10-30 ENCOUNTER — Encounter: Payer: Self-pay | Admitting: Internal Medicine

## 2021-10-30 VITALS — BP 104/72 | HR 60 | Ht 65.0 in | Wt 162.0 lb

## 2021-10-30 DIAGNOSIS — Z23 Encounter for immunization: Secondary | ICD-10-CM

## 2021-10-30 DIAGNOSIS — H9193 Unspecified hearing loss, bilateral: Secondary | ICD-10-CM | POA: Diagnosis not present

## 2021-10-30 DIAGNOSIS — Z975 Presence of (intrauterine) contraceptive device: Secondary | ICD-10-CM | POA: Diagnosis not present

## 2021-10-30 DIAGNOSIS — Z1231 Encounter for screening mammogram for malignant neoplasm of breast: Secondary | ICD-10-CM | POA: Diagnosis not present

## 2021-10-30 NOTE — Progress Notes (Signed)
Date:  10/30/2021   Name:  Lydia Munoz   DOB:  03-Dec-1980   MRN:  [id number]   Chief Complaint: New Patient (Initial Visit), Establish Care, and Hearing Problem (X1 year)  HPI Hearing loss - she has noticed over the past year or so that she can not hear as well.  Family and coworkers have commented.  She turns the TV up higher as well.  No ear pain or trauma, no tinnitus or dizziness.  No chronic sinus symptoms -she does have mild allergies treated with Flonase.   Lab Results  Component Value Date   NA 142 08/30/2019   K 4.2 08/30/2019   CO2 21 08/30/2019   GLUCOSE 86 08/30/2019   BUN 10 08/30/2019   CREATININE 0.82 08/30/2019   CALCIUM 8.8 08/30/2019   GFRNONAA 91 08/30/2019   Lab Results  Component Value Date   CHOL 165 08/30/2019   HDL 64 08/30/2019   LDLCALC 85 08/30/2019   TRIG 85 08/30/2019   CHOLHDL 2.6 08/30/2019   Lab Results  Component Value Date   TSH 2.230 08/30/2019   No results found for: HGBA1C Lab Results  Component Value Date   WBC 3.7 08/30/2019   HGB 13.5 08/30/2019   HCT 38.6 08/30/2019   MCV 91 08/30/2019   PLT 115 (L) 07/03/2014   No results found for: ALT, AST, GGT, ALKPHOS, BILITOT No results found for: 25OHVITD2, 25OHVITD3, VD25OH   Review of Systems  Constitutional:  Positive for unexpected weight change (has gained about 20 lbs over the past few years). Negative for appetite change, fatigue and fever.  HENT:  Positive for hearing loss. Negative for ear pain, sinus pressure and tinnitus.   Eyes:  Negative for visual disturbance.  Respiratory:  Negative for cough, chest tightness and shortness of breath.   Cardiovascular:  Negative for chest pain and palpitations.  Genitourinary:  Negative for menstrual problem (has IUD and no menses).  Neurological:  Negative for dizziness, light-headedness and headaches.  Psychiatric/Behavioral:  Negative for dysphoric mood and sleep disturbance. The patient is not nervous/anxious.    Patient  Active Problem List   Diagnosis Date Noted   Sinus congestion 11/12/2020    Allergies  Allergen Reactions   Penicillins Hives and Diarrhea    Past Surgical History:  Procedure Laterality Date   CESAREAN SECTION  2012   CESAREAN SECTION  2015   WISDOM TOOTH EXTRACTION Bilateral     Social History   Tobacco Use   Smoking status: Never   Smokeless tobacco: Never  Vaping Use   Vaping Use: Never used  Substance Use Topics   Alcohol use: Yes    Alcohol/week: 4.0 standard drinks    Types: 4 Standard drinks or equivalent per week   Drug use: Never    Medication list has been reviewed and updated.  Current Meds  Medication Sig   fluticasone (FLONASE) 50 MCG/ACT nasal spray Place into both nostrils daily.   Multiple Vitamins-Minerals (MULTIVITAMIN WITH MINERALS) tablet Take 1 tablet by mouth daily.   saccharomyces boulardii (FLORASTOR) 250 MG capsule Take 250 mg by mouth 2 (two) times daily.   Current Facility-Administered Medications for the 10/30/21 encounter (Office Visit) with Reubin MilanBerglund, Tarron Krolak H, MD  Medication   levonorgestrel (MIRENA) 20 MCG/24HR IUD    [facility] 10/30/2021 07/20/2019 06/05/2016  PHQ - 2 Score 0 0 0  PHQ- 9 Score 2 2 -    GAD 7 : Generalized Anxiety Score 10/30/2021  Nervous, Anxious, on  Edge 1  Control/stop worrying 0  Worry too much - different things 0  Trouble relaxing 0  Restless 1  Easily annoyed or irritable 1  Afraid - awful might happen 0  Total GAD 7 Score 3  Anxiety Difficulty Not difficult at all    BP Readings from Last 3 Encounters:  10/30/21 104/72  11/12/20 118/75  08/24/19 120/70    Physical Exam Vitals and nursing note reviewed.  Constitutional:      General: She is not in acute distress.    Appearance: Normal appearance. She is well-developed.  HENT:     Head: Normocephalic and atraumatic.     Right Ear: Tympanic membrane and ear canal normal.     Left Ear: Tympanic membrane and ear canal normal.     Ears:      Comments: Hearing grossly normal. Neck:     Vascular: No carotid bruit.  Cardiovascular:     Rate and Rhythm: Normal rate and regular rhythm.     Pulses: Normal pulses.     Heart sounds: No murmur heard. Pulmonary:     Effort: Pulmonary effort is normal. No respiratory distress.     Breath sounds: No wheezing or rhonchi.  Abdominal:     General: Abdomen is flat.     Palpations: Abdomen is soft. There is no mass.     Tenderness: There is no abdominal tenderness. There is no guarding or rebound.  Musculoskeletal:     Cervical back: Normal range of motion.     Right lower leg: No edema.     Left lower leg: No edema.  Lymphadenopathy:     Cervical: No cervical adenopathy.  Skin:    General: Skin is warm and dry.     Findings: No rash.  Neurological:     General: No focal deficit present.     Mental Status: She is alert and oriented to person, place, and time.  Psychiatric:        Mood and Affect: Mood normal.        Behavior: Behavior normal.    Wt Readings from Last 3 Encounters:  10/30/21 162 lb (73.5 kg)  08/24/19 155 lb (70.3 kg)  07/19/19 153 lb (69.4 kg)    BP 104/72 (BP Location: Right Arm, Patient Position: Sitting, Cuff Size: Normal)    Pulse 60    Ht 5\' 5"  (1.651 m)    Wt 162 lb (73.5 kg)    SpO2 98%    BMI 26.96 kg/m   Assessment and Plan: 1. Bilateral hearing loss, unspecified hearing loss type No obvious cause noted on exam Refer to ENT. - Ambulatory referral to ENT  2. Encounter for screening mammogram for breast cancer Schedule at [place]  3. IUD (intrauterine device) in place Placed in 2020 Pap with HPV done in 2020  4. Need for diphtheria-tetanus-pertussis (Tdap) vaccine Given today. - Tdap vaccine greater than or equal to 7yo IM   Partially dictated using Editor, commissioning. Any errors are unintentional.  Halina Maidens, MD [facility]  10/30/2021

## 2021-11-14 ENCOUNTER — Telehealth: Payer: Self-pay

## 2021-11-14 NOTE — Telephone Encounter (Signed)
Called pt as a reminder to call and schedule mammogram. Pt verbalized understanding.  KP 

## 2021-12-06 DIAGNOSIS — H9313 Tinnitus, bilateral: Secondary | ICD-10-CM | POA: Diagnosis not present

## 2021-12-06 DIAGNOSIS — H93293 Other abnormal auditory perceptions, bilateral: Secondary | ICD-10-CM | POA: Diagnosis not present

## 2022-02-07 ENCOUNTER — Ambulatory Visit
Admission: RE | Admit: 2022-02-07 | Discharge: 2022-02-07 | Disposition: A | Discharge status: Home / Self Care | Payer: BC Managed Care – PPO | Source: Ambulatory Visit | Attending: Internal Medicine | Admitting: Internal Medicine

## 2022-02-07 DIAGNOSIS — Z1231 Encounter for screening mammogram for malignant neoplasm of breast: Secondary | ICD-10-CM | POA: Diagnosis not present

## 2022-03-04 ENCOUNTER — Ambulatory Visit (INDEPENDENT_AMBULATORY_CARE_PROVIDER_SITE_OTHER): Payer: BC Managed Care – PPO | Admitting: Internal Medicine

## 2022-03-04 ENCOUNTER — Encounter: Payer: Self-pay | Admitting: Internal Medicine

## 2022-03-04 VITALS — BP 128/70 | HR 80 | Ht 65.0 in | Wt 164.2 lb

## 2022-03-04 DIAGNOSIS — Z1159 Encounter for screening for other viral diseases: Secondary | ICD-10-CM | POA: Diagnosis not present

## 2022-03-04 DIAGNOSIS — Z1231 Encounter for screening mammogram for malignant neoplasm of breast: Secondary | ICD-10-CM

## 2022-03-04 DIAGNOSIS — Z975 Presence of (intrauterine) contraceptive device: Secondary | ICD-10-CM

## 2022-03-04 DIAGNOSIS — Z114 Encounter for screening for human immunodeficiency virus [HIV]: Secondary | ICD-10-CM

## 2022-03-04 DIAGNOSIS — Z0000 Encounter for general adult medical examination without abnormal findings: Secondary | ICD-10-CM

## 2022-03-04 DIAGNOSIS — Z13220 Encounter for screening for lipoid disorders: Secondary | ICD-10-CM | POA: Diagnosis not present

## 2022-03-04 NOTE — Progress Notes (Signed)
Date:  03/04/2022   Name:  Lydia Munoz   DOB:  16-Dec-1980   MRN:  [id number]   Chief Complaint: Annual Exam Lydia Munoz is a 41 y.o. female who presents today for her Complete Annual Exam. She feels well. She reports exercising. She reports she is sleeping fairly well. Breast complaints - none.  She has an IUD placed about 2020 - doing well without menstrual issues.  Mammogram: 01/2022  DEXA: none Pap smear: 06/2019 neg with co-testing Colonoscopy: none  Health Maintenance Due  Topic Date Due   Hepatitis C Screening  Never done    Immunization History  Administered Date(s) Administered   Influenza,inj,Quad PF,6+ Mos 06/30/2019, 07/10/2021   Influenza-Unspecified 07/21/2017, 07/26/2018, 07/25/2020   PFIZER(Purple Top)SARS-COV-2 Vaccination 12/02/2019, 12/26/2019, 08/09/2020   Pfizer Covid-19 Vaccine Bivalent Booster 52yrs & up 08/08/2021   Tdap 10/30/2021    HPI  Lab Results  Component Value Date   NA 142 08/30/2019   K 4.2 08/30/2019   CO2 21 08/30/2019   GLUCOSE 86 08/30/2019   BUN 10 08/30/2019   CREATININE 0.82 08/30/2019   CALCIUM 8.8 08/30/2019   GFRNONAA 91 08/30/2019   Lab Results  Component Value Date   CHOL 165 08/30/2019   HDL 64 08/30/2019   LDLCALC 85 08/30/2019   TRIG 85 08/30/2019   CHOLHDL 2.6 08/30/2019   Lab Results  Component Value Date   TSH 2.230 08/30/2019   No results found for: HGBA1C Lab Results  Component Value Date   WBC 3.7 08/30/2019   HGB 13.5 08/30/2019   HCT 38.6 08/30/2019   MCV 91 08/30/2019   PLT 115 (L) 07/03/2014   No results found for: ALT, AST, GGT, ALKPHOS, BILITOT No results found for: 25OHVITD2, 25OHVITD3, VD25OH   Review of Systems  Constitutional:  Negative for chills, fatigue and fever.  HENT:  Negative for congestion, hearing loss, tinnitus, trouble swallowing and voice change.   Eyes:  Negative for visual disturbance.  Respiratory:  Negative for cough, chest tightness, shortness of breath and  wheezing.   Cardiovascular:  Negative for chest pain, palpitations and leg swelling.  Gastrointestinal:  Negative for abdominal pain, constipation, diarrhea and vomiting.  Endocrine: Negative for polydipsia and polyuria.  Genitourinary:  Negative for dysuria, frequency, genital sores, vaginal bleeding and vaginal discharge.  Musculoskeletal:  Negative for arthralgias, gait problem and joint swelling.  Skin:  Negative for color change and rash.  Neurological:  Negative for dizziness, tremors, light-headedness and headaches.  Hematological:  Negative for adenopathy. Does not bruise/bleed easily.  Psychiatric/Behavioral:  Positive for sleep disturbance (wakes up most nights around 3 AM). Negative for dysphoric mood. The patient is not nervous/anxious.    Patient Active Problem List   Diagnosis Date Noted   IUD (intrauterine device) in place 10/30/2021   Bilateral hearing loss 10/30/2021    Allergies  Allergen Reactions   Penicillins Hives and Diarrhea    Past Surgical History:  Procedure Laterality Date   CESAREAN SECTION  2012   CESAREAN SECTION  2015   WISDOM TOOTH EXTRACTION Bilateral     Social History   Tobacco Use   Smoking status: Never   Smokeless tobacco: Never  Vaping Use   Vaping Use: Never used  Substance Use Topics   Alcohol use: Yes    Alcohol/week: 4.0 standard drinks    Types: 4 Standard drinks or equivalent per week   Drug use: Never     Medication list has been reviewed and updated.  Current  Meds  Medication Sig   fluticasone (FLONASE) 50 MCG/ACT nasal spray Place into both nostrils daily.   Multiple Vitamins-Minerals (MULTIVITAMIN WITH MINERALS) tablet Take 1 tablet by mouth daily.   saccharomyces boulardii (FLORASTOR) 250 MG capsule Take 250 mg by mouth 2 (two) times daily.   Current Facility-Administered Medications for the 03/04/22 encounter (Office Visit) with Glean Hess, MD  Medication   levonorgestrel (MIRENA) 20 MCG/24HR IUD        03/04/2022    9:19 AM 10/30/2021    3:12 PM  GAD 7 : Generalized Anxiety Score  Nervous, Anxious, on Edge 1 1  Control/stop worrying 1 0  Worry too much - different things 1 0  Trouble relaxing 0 0  Restless 0 1  Easily annoyed or irritable 2 1  Afraid - awful might happen 1 0  Total GAD 7 Score 6 3  Anxiety Difficulty Somewhat difficult Not difficult at all       03/04/2022    9:19 AM  Depression screen PHQ 2/9  Decreased Interest 0  Down, Depressed, Hopeless 0  PHQ - 2 Score 0  Altered sleeping 1  Tired, decreased energy 0  Change in appetite 0  Feeling bad or failure about yourself  0  Trouble concentrating 0  Moving slowly or fidgety/restless 0  Suicidal thoughts 0  PHQ-9 Score 1  Difficult doing work/chores Not difficult at all    BP Readings from Last 3 Encounters:  03/04/22 128/70  10/30/21 104/72  11/12/20 118/75    Physical Exam Vitals and nursing note reviewed.  Constitutional:      General: She is not in acute distress.    Appearance: She is well-developed.  HENT:     Head: Normocephalic and atraumatic.     Right Ear: Tympanic membrane and ear canal normal.     Left Ear: Tympanic membrane and ear canal normal.     Nose:     Right Sinus: No maxillary sinus tenderness.     Left Sinus: No maxillary sinus tenderness.  Eyes:     General: No scleral icterus.       Right eye: No discharge.        Left eye: No discharge.     Conjunctiva/sclera: Conjunctivae normal.  Neck:     Thyroid: No thyromegaly.     Vascular: No carotid bruit.  Cardiovascular:     Rate and Rhythm: Normal rate and regular rhythm.     Pulses: Normal pulses.     Heart sounds: Normal heart sounds.  Pulmonary:     Effort: Pulmonary effort is normal. No respiratory distress.     Breath sounds: No wheezing.  Abdominal:     General: Bowel sounds are normal.     Palpations: Abdomen is soft.     Tenderness: There is no abdominal tenderness.  Musculoskeletal:     Cervical back: Normal  range of motion. No erythema.     Right lower leg: No edema.     Left lower leg: No edema.  Lymphadenopathy:     Cervical: No cervical adenopathy.  Skin:    General: Skin is warm and dry.     Findings: No rash.  Neurological:     Mental Status: She is alert and oriented to person, place, and time.     Cranial Nerves: No cranial nerve deficit.     Sensory: No sensory deficit.     Deep Tendon Reflexes: Reflexes are normal and symmetric.  Psychiatric:  Attention and Perception: Attention normal.        Mood and Affect: Mood normal.    Wt Readings from Last 3 Encounters:  03/04/22 164 lb 3.2 oz (74.5 kg)  10/30/21 162 lb (73.5 kg)  08/24/19 155 lb (70.3 kg)    BP 128/70   Pulse 80   Ht 5\' 5"  (1.651 m)   Wt 164 lb 3.2 oz (74.5 kg)   SpO2 97%   BMI 27.32 kg/m   Assessment and Plan: 1. Annual physical exam Normal exam. Up to date on screenings and immunizations. Continue healthy diet and regular exercise. Continue good sleep hygiene and follow up if worsening - CBC with Differential/Platelet - Comprehensive metabolic panel - Lipid panel - TSH  2. Encounter for screening mammogram for breast cancer Done recently - no concerns  3. IUD (intrauterine device) in place Doing well without abnormal bleeding or discomfort Recommend GYN follow up this year  4. Need for hepatitis C screening test - Hepatitis C antibody  5. Encounter for screening for HIV - HIV Antibody (routine testing w rflx)   Partially dictated using Editor, commissioning. Any errors are unintentional.  Halina Maidens, MD [facility]  03/04/2022

## 2022-03-05 LAB — CBC WITH DIFFERENTIAL/PLATELET
Basophils Absolute: 0.1 10*3/uL (ref 0.0–0.2)
Basos: 1 %
EOS (ABSOLUTE): 0.1 10*3/uL (ref 0.0–0.4)
Eos: 1 %
Hematocrit: 40.6 % (ref 34.0–46.6)
Hemoglobin: 14.4 g/dL (ref 11.1–15.9)
Immature Grans (Abs): 0 10*3/uL (ref 0.0–0.1)
Immature Granulocytes: 0 %
Lymphocytes Absolute: 1.5 10*3/uL (ref 0.7–3.1)
Lymphs: 28 %
MCH: 31.9 pg (ref 26.6–33.0)
MCHC: 35.5 g/dL (ref 31.5–35.7)
MCV: 90 fL (ref 79–97)
Monocytes Absolute: 0.3 10*3/uL (ref 0.1–0.9)
Monocytes: 6 %
Neutrophils Absolute: 3.4 10*3/uL (ref 1.4–7.0)
Neutrophils: 64 %
Platelets: 194 10*3/uL (ref 150–450)
RBC: 4.52 x10E6/uL (ref 3.77–5.28)
RDW: 12.6 % (ref 11.7–15.4)
WBC: 5.4 10*3/uL (ref 3.4–10.8)

## 2022-03-05 LAB — COMPREHENSIVE METABOLIC PANEL
ALT: 11 IU/L (ref 0–32)
AST: 11 IU/L (ref 0–40)
Albumin/Globulin Ratio: 2.1 (ref 1.2–2.2)
Albumin: 4.7 g/dL (ref 3.8–4.8)
Alkaline Phosphatase: 48 IU/L (ref 44–121)
BUN/Creatinine Ratio: 11 (ref 9–23)
BUN: 9 mg/dL (ref 6–24)
Bilirubin Total: 0.5 mg/dL (ref 0.0–1.2)
CO2: 22 mmol/L (ref 20–29)
Calcium: 9 mg/dL (ref 8.7–10.2)
Chloride: 103 mmol/L (ref 96–106)
Creatinine, Ser: 0.8 mg/dL (ref 0.57–1.00)
Globulin, Total: 2.2 g/dL (ref 1.5–4.5)
Glucose: 92 mg/dL (ref 70–99)
Potassium: 4.1 mmol/L (ref 3.5–5.2)
Sodium: 139 mmol/L (ref 134–144)
Total Protein: 6.9 g/dL (ref 6.0–8.5)
eGFR: 95 mL/min/{1.73_m2} (ref >59)

## 2022-03-05 LAB — LIPID PANEL
Chol/HDL Ratio: 2.8 ratio (ref 0.0–4.4)
Cholesterol, Total: 172 mg/dL (ref 100–199)
HDL: 61 mg/dL (ref >39)
LDL Chol Calc (NIH): 98 mg/dL (ref 0–99)
Triglycerides: 69 mg/dL (ref 0–149)
VLDL Cholesterol Cal: 13 mg/dL (ref 5–40)

## 2022-03-05 LAB — HIV ANTIBODY (ROUTINE TESTING W REFLEX): HIV Screen 4th Generation wRfx: NONREACTIVE

## 2022-03-05 LAB — TSH: TSH: 1.96 u[IU]/mL (ref 0.450–4.500)

## 2022-03-05 LAB — HEPATITIS C ANTIBODY: Hep C Virus Ab: NONREACTIVE

## 2022-03-07 DIAGNOSIS — D235 Other benign neoplasm of skin of trunk: Secondary | ICD-10-CM | POA: Diagnosis not present

## 2022-04-24 ENCOUNTER — Ambulatory Visit (LOCAL_COMMUNITY_HEALTH_CENTER): Payer: BC Managed Care – PPO

## 2022-04-24 DIAGNOSIS — Z23 Encounter for immunization: Secondary | ICD-10-CM | POA: Diagnosis not present

## 2022-04-24 DIAGNOSIS — Z719 Counseling, unspecified: Secondary | ICD-10-CM

## 2022-04-24 NOTE — Progress Notes (Signed)
Patient seen in nurse clinic for [facility] vaccination.  Patient entering grad school at [employer] and college requiring this vaccination.  Dr. Alvester Morin gave verbal order to administer Menveo since required by college.  VIS provided.  Menveo administered in right deltoid.  Tolerated well.  [facility] updated and copy provided to patient.

## 2022-07-22 DIAGNOSIS — Z23 Encounter for immunization: Secondary | ICD-10-CM | POA: Diagnosis not present

## 2023-07-24 DIAGNOSIS — Z23 Encounter for immunization: Secondary | ICD-10-CM | POA: Diagnosis not present

## 2023-09-16 ENCOUNTER — Encounter: Payer: Self-pay | Admitting: Internal Medicine

## 2023-09-16 DIAGNOSIS — Z1231 Encounter for screening mammogram for malignant neoplasm of breast: Secondary | ICD-10-CM

## 2023-10-01 ENCOUNTER — Encounter: Payer: Self-pay | Admitting: Adult Health

## 2023-10-01 ENCOUNTER — Ambulatory Visit: Payer: Self-pay | Admitting: Adult Health

## 2023-10-01 ENCOUNTER — Other Ambulatory Visit: Payer: Self-pay

## 2023-10-01 VITALS — BP 120/80 | HR 98 | Temp 99.4°F | Ht 65.0 in | Wt 157.0 lb

## 2023-10-01 DIAGNOSIS — R6883 Chills (without fever): Secondary | ICD-10-CM

## 2023-10-01 DIAGNOSIS — J0140 Acute pansinusitis, unspecified: Secondary | ICD-10-CM

## 2023-10-01 LAB — POC SOFIA 2 FLU + SARS ANTIGEN FIA
Influenza A, POC: NEGATIVE
Influenza B, POC: NEGATIVE
SARS Coronavirus 2 Ag: NEGATIVE

## 2023-10-01 MED ORDER — DOXYCYCLINE HYCLATE 100 MG PO TABS: 100.0000 mg | ORAL_TABLET | Freq: Two times a day (BID) | ORAL | 0 refills | Status: DC

## 2023-10-01 NOTE — Progress Notes (Signed)
 Therapist, [facility] [address] [place], [place] [address]   Office Visit Note  Patient Name: Lydia Munoz Date of Birth [id number]  Medical Record number [id number]  Date of Service: 10/01/2023  Chief Complaint  Patient presents with   Sick visit    Patient c/o sinus pressure, nasal congestion, postnasal drainage, chills, soreness in neck, and itchiness inside both ears. Symptoms began about 4 days ago. She has taken Sudafed, Benadryl, and Advil which have been helpful however symptoms seemed to worsen overnight.      HPI Pt is here for a sick visit. She reports 4 days of cold, congestion, sneezing.  She has been taking sudafed, benadryl, Advil with some improvement.  However, last night the pain in the right side of her face, teeth/cheek has been worse. She has been having hot/cold flashes.  Her sister was sick over the holidays around her.     Current Medication:  Outpatient Encounter Medications as of 10/01/2023  Medication Sig   doxycycline  (VIBRA -TABS) 100 MG tablet Take 1 tablet (100 mg total) by mouth 2 (two) times daily.   fluticasone (FLONASE) 50 MCG/ACT nasal spray Place into both nostrils daily as needed.   Multiple Vitamins-Minerals (MULTIVITAMIN WITH MINERALS) tablet Take 1 tablet by mouth daily.   [DISCONTINUED] saccharomyces boulardii (FLORASTOR) 250 MG capsule Take 250 mg by mouth 2 (two) times daily.   Facility-Administered Encounter Medications as of 10/01/2023  Medication   levonorgestrel  (MIRENA ) 20 MCG/24HR IUD      Medical History: Past Medical History:  Diagnosis Date   Closed fracture of left distal radius 12/13/2017     Vital Signs: BP 120/80   Pulse 98   Temp 99.4 F (37.4 C)   Ht 5' 5 (1.651 m)   Wt 157 lb (71.2 kg)   SpO2 98%   BMI 26.13 kg/m    Review of Systems  Constitutional:  Negative for chills, fatigue and fever.  HENT:  Positive for congestion, rhinorrhea, sinus pressure and sinus pain. Negative for sore throat.   Eyes:   Negative for pain, redness and itching.  Respiratory:  Negative for cough.   Cardiovascular:  Negative for chest pain.  Gastrointestinal:  Negative for diarrhea, nausea and vomiting.    Physical Exam Vitals reviewed.  Constitutional:      Appearance: Normal appearance.  HENT:     Head: Normocephalic.     Right Ear: Tympanic membrane and ear canal normal.     Left Ear: Tympanic membrane and ear canal normal.     Nose:     Right Sinus: Maxillary sinus tenderness and frontal sinus tenderness present.     Left Sinus: No maxillary sinus tenderness or frontal sinus tenderness.     Mouth/Throat:     Mouth: Mucous membranes are moist.  Eyes:     Pupils: Pupils are equal, round, and reactive to light.  Pulmonary:     Effort: Pulmonary effort is normal.  Lymphadenopathy:     Cervical: No cervical adenopathy.  Neurological:     Mental Status: She is alert.      Results for orders placed or performed in visit on 10/01/23 (from the past 24 hours)  POC SOFIA 2 FLU + SARS ANTIGEN FIA     Status: None   Collection Time: 10/01/23 10:27 AM  Result Value Ref Range   Influenza A, POC Negative Negative   Influenza B, POC Negative Negative   SARS Coronavirus 2 Ag Negative Negative    Assessment/Plan: 1. Acute  non-recurrent pansinusitis (Primary) Patient Instructions: -Take complete course of antibiotics as prescribed.  Take with food.  -Try Flonase/Fluticasone nasal spray, 2 sprays to each nostril once a day. -You can try using a neti pot or nasal saline rinse product to help clear mucus congestion. -Rest and stay well hydrated (by drinking water and other liquids). Avoid/limit caffeine. -Take over-the-counter medicines (i.e. Mucinex, decongestant, Ibuprofen or Tylenol, cough suppressant) to help relieve your symptoms. -For your cough, use cough drops/throat lozenges, gargle warm salt water and/or drink warm liquids (like tea with honey). -Send my chart message to provider or schedule  return visit as needed for new/worsening symptoms or if symptoms do not improve as discussed with antibiotic and other recommended treatment.   - doxycycline  (VIBRA -TABS) 100 MG tablet; Take 1 tablet (100 mg total) by mouth 2 (two) times daily.  Dispense: 20 tablet; Refill: 0  2. Chills - POC SOFIA 2 FLU + SARS ANTIGEN FIA     General Counseling: Enriqueta verbalizes understanding of the findings of todays visit and agrees with plan of treatment. I have discussed any further diagnostic evaluation that may be needed or ordered today. We also reviewed her medications today. she has been encouraged to call the office with any questions or concerns that should arise related to todays visit.   Orders Placed This Encounter  Procedures   POC SOFIA 2 FLU + SARS ANTIGEN FIA    Meds ordered this encounter  Medications   doxycycline  (VIBRA -TABS) 100 MG tablet    Sig: Take 1 tablet (100 mg total) by mouth 2 (two) times daily.    Dispense:  20 tablet    Refill:  0    Time spent:15 Minutes    Juliene DOROTHA Howells AGNP-C Nurse Practitioner

## 2023-10-12 ENCOUNTER — Other Ambulatory Visit: Payer: Self-pay

## 2023-10-12 DIAGNOSIS — E538 Deficiency of other specified B group vitamins: Secondary | ICD-10-CM | POA: Diagnosis not present

## 2023-10-12 DIAGNOSIS — Z1331 Encounter for screening for depression: Secondary | ICD-10-CM | POA: Diagnosis not present

## 2023-10-12 DIAGNOSIS — R6882 Decreased libido: Secondary | ICD-10-CM | POA: Diagnosis not present

## 2023-10-12 DIAGNOSIS — Z1339 Encounter for screening examination for other mental health and behavioral disorders: Secondary | ICD-10-CM | POA: Diagnosis not present

## 2023-10-12 DIAGNOSIS — R5383 Other fatigue: Secondary | ICD-10-CM | POA: Diagnosis not present

## 2023-10-12 DIAGNOSIS — R7989 Other specified abnormal findings of blood chemistry: Secondary | ICD-10-CM | POA: Diagnosis not present

## 2023-10-12 DIAGNOSIS — Z1231 Encounter for screening mammogram for malignant neoplasm of breast: Secondary | ICD-10-CM

## 2023-10-12 DIAGNOSIS — Z01411 Encounter for gynecological examination (general) (routine) with abnormal findings: Secondary | ICD-10-CM | POA: Diagnosis not present

## 2023-10-12 DIAGNOSIS — Z1151 Encounter for screening for human papillomavirus (HPV): Secondary | ICD-10-CM | POA: Diagnosis not present

## 2023-10-12 DIAGNOSIS — Z124 Encounter for screening for malignant neoplasm of cervix: Secondary | ICD-10-CM | POA: Diagnosis not present

## 2023-10-12 DIAGNOSIS — Z13220 Encounter for screening for lipoid disorders: Secondary | ICD-10-CM | POA: Diagnosis not present

## 2023-10-12 DIAGNOSIS — Z131 Encounter for screening for diabetes mellitus: Secondary | ICD-10-CM | POA: Diagnosis not present

## 2023-10-12 LAB — COMPREHENSIVE METABOLIC PANEL WITH GFR: eGFR: 82

## 2023-10-12 LAB — LIPID PANEL
Cholesterol: 171 (ref 0–200)
HDL: 49 (ref 35–70)
LDL Cholesterol: 100
Triglycerides: 104 (ref 40–160)

## 2023-10-12 LAB — BASIC METABOLIC PANEL WITH GFR
BUN: 16 (ref 4–21)
CO2: 27 — AB (ref 13–22)
Chloride: 106 (ref 99–108)
Creatinine: 0.9 (ref 0.5–1.1)
Glucose: 75
Potassium: 4.1 meq/L (ref 3.5–5.1)
Sodium: 140 (ref 137–147)

## 2023-10-12 LAB — HM PAP SMEAR

## 2023-10-12 LAB — IRON,TIBC AND FERRITIN PANEL
%SAT: 197
Ferritin: 86
Iron: 116
TIBC: 276

## 2023-10-12 LAB — CBC AND DIFFERENTIAL
HCT: 41 (ref 36–46)
Hemoglobin: 14.5 (ref 12.0–16.0)
Platelets: 219 10*3/uL (ref 150–400)
WBC: 5.1

## 2023-10-12 LAB — HEPATIC FUNCTION PANEL
ALT: 11 U/L (ref 7–35)
AST: 12 — AB (ref 13–35)
Alkaline Phosphatase: 51 (ref 25–125)

## 2023-10-12 LAB — VITAMIN D 25 HYDROXY (VIT D DEFICIENCY, FRACTURES): Vit D, 25-Hydroxy: 33

## 2023-10-12 LAB — TSH: TSH: 1.4 (ref 0.41–5.90)

## 2023-10-12 LAB — TESTOSTERONE: Testosterone: 16

## 2023-10-12 LAB — HEMOGLOBIN A1C: Hemoglobin A1C: 5

## 2023-10-12 LAB — VITAMIN B12: Vitamin B-12: 247

## 2023-10-12 LAB — HM MAMMOGRAPHY

## 2023-10-12 LAB — RESULTS CONSOLE HPV: CHL HPV: NEGATIVE

## 2023-10-16 ENCOUNTER — Ambulatory Visit
Admission: RE | Admit: 2023-10-16 | Discharge: 2023-10-16 | Disposition: A | Discharge status: Home / Self Care | Payer: BC Managed Care – PPO | Source: Ambulatory Visit

## 2023-10-16 DIAGNOSIS — Z1231 Encounter for screening mammogram for malignant neoplasm of breast: Secondary | ICD-10-CM | POA: Diagnosis not present

## 2023-11-22 IMAGING — MG MM DIGITAL SCREENING BILAT W/ TOMO AND CAD
8 series · 8 of 24 positions shown · non-contrast
Comparison: This is the patient's baseline mammogram.

CLINICAL DATA: Screening.

EXAM:
DIGITAL SCREENING BILATERAL MAMMOGRAM WITH TOMOSYNTHESIS AND CAD
TECHNIQUE: Bilateral screening digital craniocaudal and mediolateral oblique
mammograms were obtained. Bilateral screening digital breast
tomosynthesis was performed. The images were evaluated with
computer-aided detection.

[R CC synth-2D]
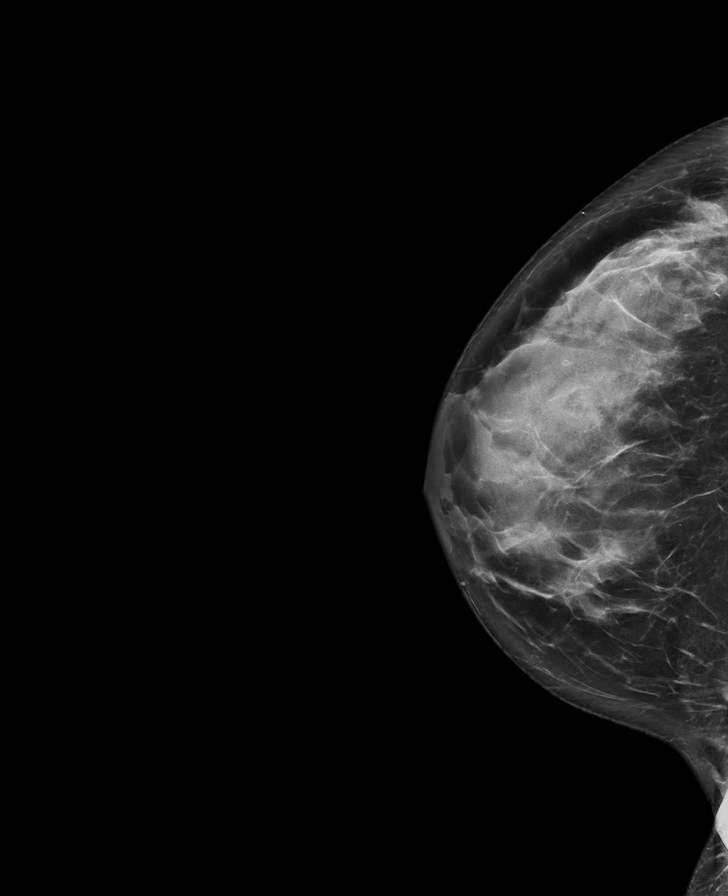

[L CC synth-2D]
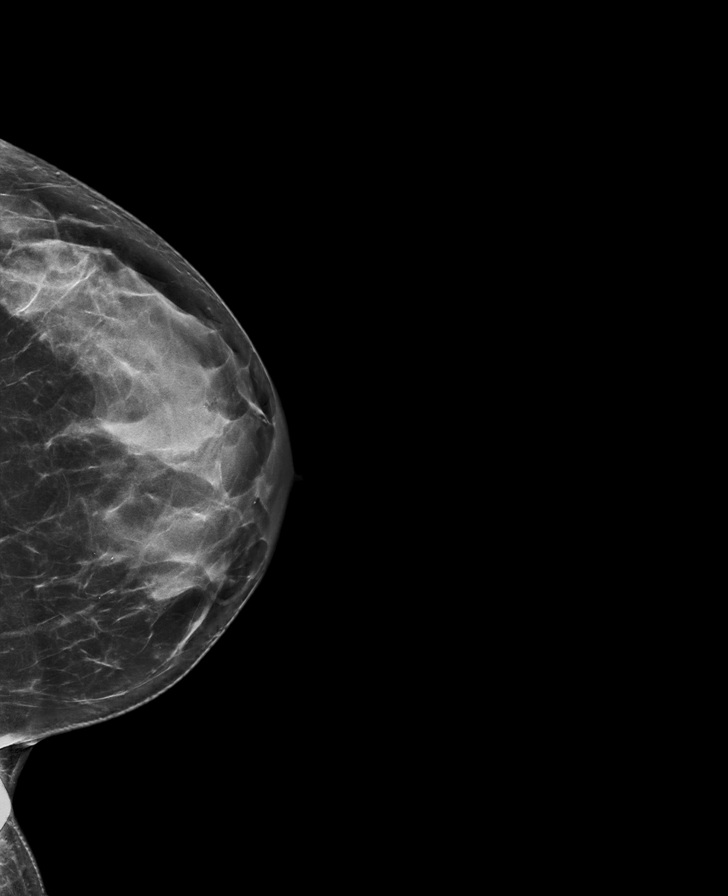

[L MLO synth-2D]
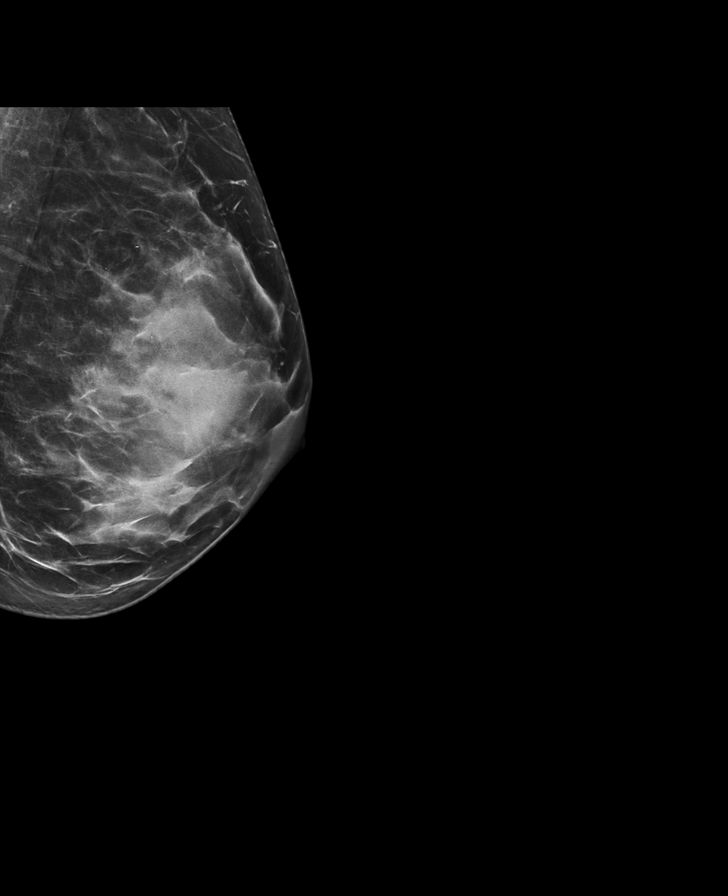

[R MLO synth-2D]
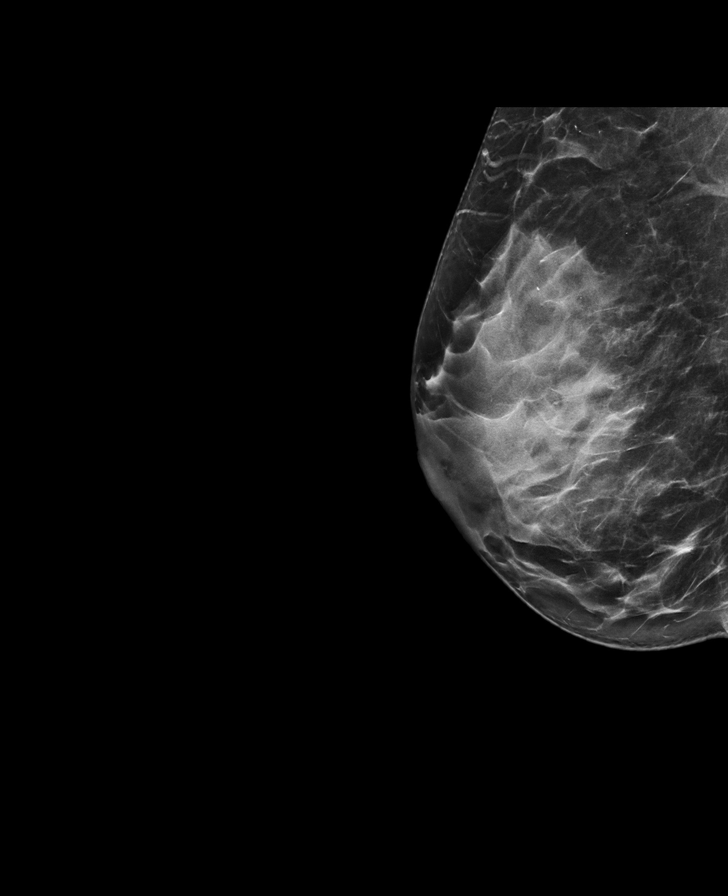

[L CC tomo · tomo slice 40/79.0]
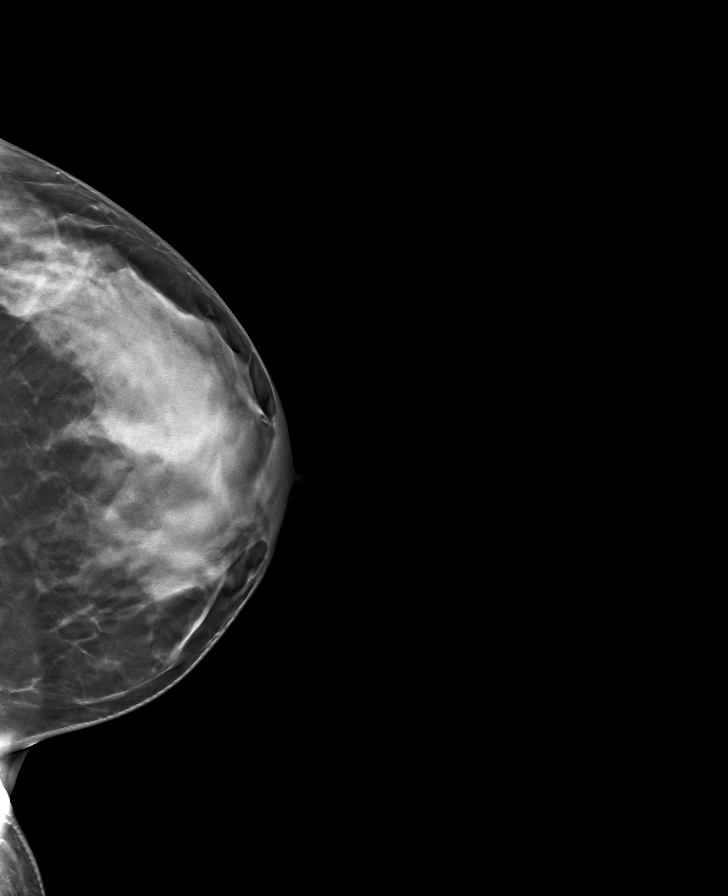

[L MLO tomo · tomo slice 39/76.0]
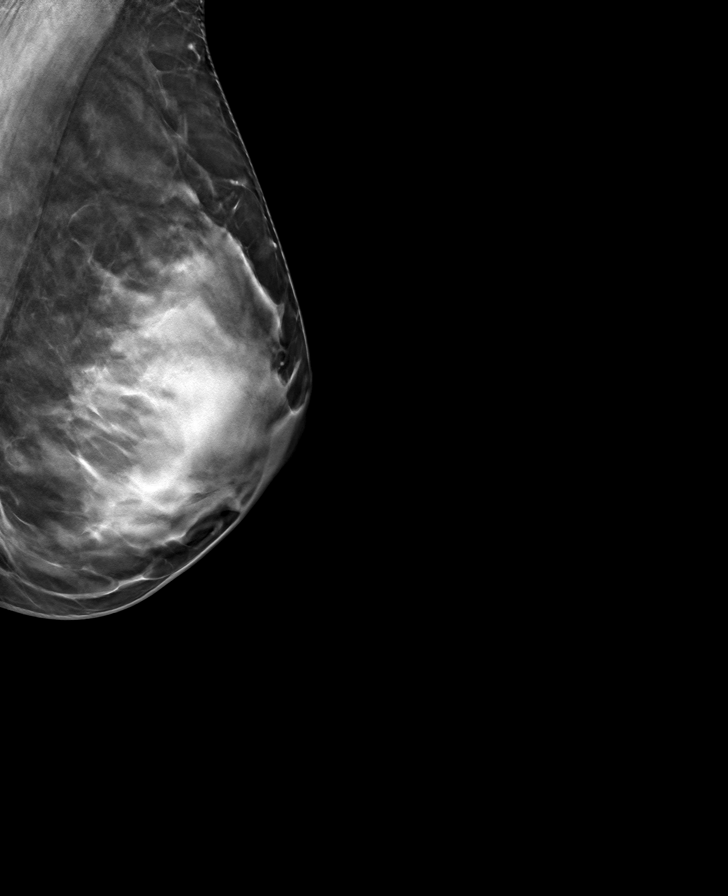

[R CC tomo · tomo slice 41/80.0]
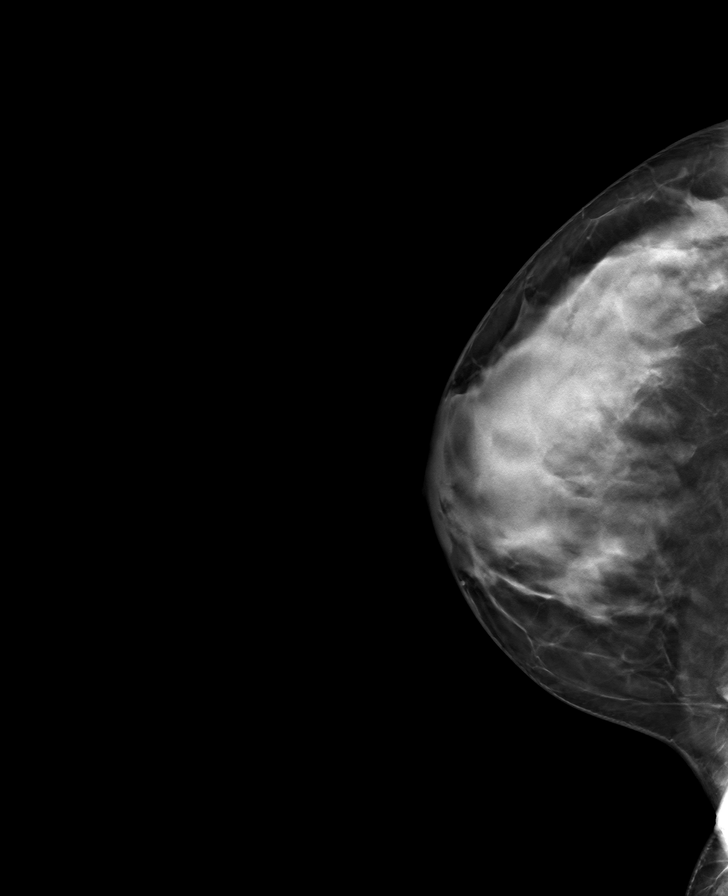

[R MLO tomo · tomo slice 39/76.0]
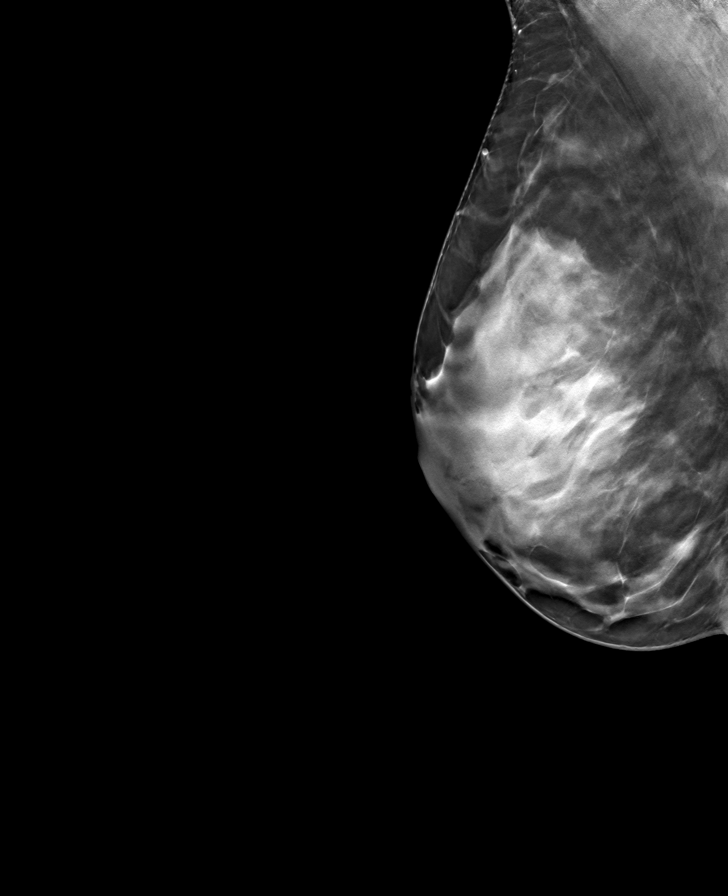

[8 of 24 positions shown; findings below may reference images not displayed]

ACR Breast Density Category d: The breast tissue is extremely dense,
which lowers the sensitivity of mammography.
FINDINGS: There are no findings suspicious for malignancy.
IMPRESSION: No mammographic evidence of malignancy. A result letter of this
screening mammogram will be mailed directly to the patient.

RECOMMENDATION:
Screening mammogram in one year. (Code:GD-B-8ER)

BI-RADS CATEGORY  1: Negative.

## 2024-01-28 NOTE — Progress Notes (Signed)
 Date:  01/29/2024   Name:  Lydia Munoz   DOB:  [birth year]   MRN:  [id number]   Chief Complaint: Annual Exam Lydia Munoz is a 43 y.o. female who presents today for her Complete Annual Exam. She feels well. She reports exercising walks, lifts weights 2 - 3 times a week. She reports she is sleeping well. Breast complaints none.  She reports doing well.    Health Maintenance  Topic Date Due   COVID-19 Vaccine (5 - 2024-25 season) 02/14/2024*   Flu Shot  04/29/2024   Pap with HPV screening  10/11/2026   DTaP/Tdap/Td vaccine (2 - Td or Tdap) 10/31/2031   Hepatitis C Screening  Completed   HIV Screening  Completed   HPV Vaccine  Aged Out   Meningitis B Vaccine  Aged Out  *Topic was postponed. The date shown is not the original due date.     Gastroesophageal Reflux She complains of heartburn. She reports no abdominal pain, no chest pain, no choking, no coughing, no early satiety, no sore throat or no wheezing. This is a recurrent problem. The current episode started more than 1 month ago. The problem occurs occasionally (1-2 times per week). The heartburn duration is less than a minute. The heartburn is located in the substernum. The heartburn is of mild intensity. The heartburn does not wake her from sleep. The heartburn does not limit her activity. The heartburn doesn't change with position. The symptoms are aggravated by certain foods. Pertinent negatives include no fatigue. She has tried an antacid for the symptoms. The treatment provided significant relief.  Back Pain This is a new problem. The problem occurs intermittently. The pain is present in the lumbar spine. The pain is mild. The symptoms are aggravated by twisting and bending. Pertinent negatives include no abdominal pain, chest pain, dysuria, headaches, pelvic pain or weakness. She has tried chiropractic manipulation, NSAIDs, heat and analgesics for the symptoms. The treatment provided significant relief.    Review of Systems   Constitutional:  Negative for fatigue and unexpected weight change.  HENT:  Negative for sore throat and trouble swallowing.   Eyes:  Negative for visual disturbance.  Respiratory:  Negative for cough, choking, chest tightness, shortness of breath and wheezing.   Cardiovascular:  Negative for chest pain, palpitations and leg swelling.  Gastrointestinal:  Positive for heartburn. Negative for abdominal pain, constipation and diarrhea.  Genitourinary:  Negative for dysuria, pelvic pain and urgency.  Musculoskeletal:  Positive for back pain. Negative for arthralgias and myalgias.  Neurological:  Negative for dizziness, weakness, light-headedness and headaches.  Psychiatric/Behavioral:  Negative for dysphoric mood and sleep disturbance. The patient is not nervous/anxious.      Lab Results  Component Value Date   NA 140 10/12/2023   K 4.1 10/12/2023   CO2 27 (A) 10/12/2023   GLUCOSE 92 03/04/2022   BUN 16 10/12/2023   CREATININE 0.9 10/12/2023   CALCIUM 9.0 03/04/2022   EGFR 82 10/12/2023   GFRNONAA 91 08/30/2019   Lab Results  Component Value Date   CHOL 171 10/12/2023   HDL 49 10/12/2023   LDLCALC 100 10/12/2023   TRIG 104 10/12/2023   CHOLHDL 2.8 03/04/2022   Lab Results  Component Value Date   TSH 1.40 10/12/2023   Lab Results  Component Value Date   HGBA1C 5 10/12/2023   Lab Results  Component Value Date   WBC 5.1 10/12/2023   HGB 14.5 10/12/2023   HCT 41 10/12/2023   MCV  90 03/04/2022   PLT 219 10/12/2023   Lab Results  Component Value Date   ALT 11 10/12/2023   AST 12 (A) 10/12/2023   ALKPHOS 51 10/12/2023   BILITOT 0.5 03/04/2022   Lab Results  Component Value Date   VD25OH 33 10/12/2023     Patient Active Problem List   Diagnosis Date Noted   B12 deficiency 01/29/2024   Low back pain without sciatica 01/29/2024   Gastroesophageal reflux disease without esophagitis 01/29/2024   IUD (intrauterine device) in place 10/30/2021   Bilateral hearing loss  10/30/2021    Allergies  Allergen Reactions   Penicillins Hives and Diarrhea    Past Surgical History:  Procedure Laterality Date   CESAREAN SECTION  2012   CESAREAN SECTION  2015   WISDOM TOOTH EXTRACTION Bilateral     Social History   Tobacco Use   Smoking status: Never   Smokeless tobacco: Never  Vaping Use   Vaping status: Never Used  Substance Use Topics   Alcohol use: Yes    Alcohol/week: 4.0 standard drinks of alcohol    Types: 4 Standard drinks or equivalent per week   Drug use: Never     Medication list has been reviewed and updated.  Current Meds  Medication Sig   Multiple Vitamins-Minerals (MULTIVITAMIN WITH MINERALS) tablet Take 1 tablet by mouth daily.   Current Facility-Administered Medications for the 01/29/24 encounter (Office Visit) with Sheron Dixons, MD  Medication   levonorgestrel  (MIRENA ) 20 MCG/24HR IUD       01/29/2024    8:09 AM 03/04/2022    9:19 AM 10/30/2021    3:12 PM  GAD 7 : Generalized Anxiety Score  Nervous, Anxious, on Edge 1 1 1   Control/stop worrying 0 1 0  Worry too much - different things 0 1 0  Trouble relaxing 1 0 0  Restless 0 0 1  Easily annoyed or irritable 1 2 1   Afraid - awful might happen 0 1 0  Total GAD 7 Score 3 6 3   Anxiety Difficulty Not difficult at all Somewhat difficult Not difficult at all       01/29/2024    8:08 AM 03/04/2022    9:19 AM 10/30/2021    3:12 PM  Depression screen PHQ 2/9  Decreased Interest 0 0 0  Down, Depressed, Hopeless 0 0 0  PHQ - 2 Score 0 0 0  Altered sleeping 1 1 1   Tired, decreased energy 1 0 1  Change in appetite 0 0 0  Feeling bad or failure about yourself  0 0 0  Trouble concentrating 0 0 0  Moving slowly or fidgety/restless 0 0 0  Suicidal thoughts 0 0 0  PHQ-9 Score 2 1 2   Difficult doing work/chores Not difficult at all Not difficult at all Not difficult at all    BP Readings from Last 3 Encounters:  01/29/24 110/74  10/01/23 120/80  03/04/22 128/70     Physical Exam Vitals and nursing note reviewed.  Constitutional:      General: She is not in acute distress.    Appearance: She is well-developed.  HENT:     Head: Normocephalic and atraumatic.     Right Ear: Tympanic membrane and ear canal normal.     Left Ear: Tympanic membrane and ear canal normal.     Nose:     Right Sinus: No maxillary sinus tenderness.     Left Sinus: No maxillary sinus tenderness.  Eyes:  General: No scleral icterus.       Right eye: No discharge.        Left eye: No discharge.     Conjunctiva/sclera: Conjunctivae normal.  Neck:     Thyroid : No thyromegaly.     Vascular: No carotid bruit.  Cardiovascular:     Rate and Rhythm: Normal rate and regular rhythm.     Pulses: Normal pulses.     Heart sounds: Normal heart sounds.  Pulmonary:     Effort: Pulmonary effort is normal. No respiratory distress.     Breath sounds: No wheezing.  Abdominal:     General: Bowel sounds are normal.     Palpations: Abdomen is soft.     Tenderness: There is no abdominal tenderness. There is no guarding or rebound.     Hernia: No hernia is present.  Musculoskeletal:     Cervical back: Normal range of motion. No erythema.     Right lower leg: No edema.     Left lower leg: No edema.  Lymphadenopathy:     Cervical: No cervical adenopathy.  Skin:    General: Skin is warm and dry.     Findings: No rash.  Neurological:     Mental Status: She is alert and oriented to person, place, and time.     Cranial Nerves: No cranial nerve deficit.     Sensory: No sensory deficit.     Deep Tendon Reflexes: Reflexes are normal and symmetric.  Psychiatric:        Attention and Perception: Attention normal.        Mood and Affect: Mood normal.     Wt Readings from Last 3 Encounters:  01/29/24 160 lb 8 oz (72.8 kg)  10/01/23 157 lb (71.2 kg)  03/04/22 164 lb 3.2 oz (74.5 kg)    BP 110/74   Pulse 81   Ht 5\' 5"  (1.651 m)   Wt 160 lb 8 oz (72.8 kg)   SpO2 98%   BMI 26.71  kg/m   Assessment and Plan:  Problem List Items Addressed This Visit       Unprioritized   B12 deficiency   Continue daily oral supplementation      Low back pain without sciatica   Recommend core strengthening exercises Can use heat, nsaids, topicals as needed.      Gastroesophageal reflux disease without esophagitis   Mild recurrent symptoms Reflux symptoms are controlled on prn TUMS. Patient denies red flag symptoms - no melena, weight loss, dysphagia.       Other Visit Diagnoses       Annual physical exam    -  Primary   Recent mammo and normal labs other than B12 continue healthy diet and regular exercise no immunizations recommended at this time       No follow-ups on file.    Sheron Dixons, MD [facility] [place]

## 2024-01-29 ENCOUNTER — Encounter: Payer: Self-pay | Admitting: Internal Medicine

## 2024-01-29 ENCOUNTER — Ambulatory Visit (INDEPENDENT_AMBULATORY_CARE_PROVIDER_SITE_OTHER): Payer: Self-pay | Admitting: Internal Medicine

## 2024-01-29 VITALS — BP 110/74 | HR 81 | Ht 65.0 in | Wt 160.5 lb

## 2024-01-29 DIAGNOSIS — M545 Low back pain, unspecified: Secondary | ICD-10-CM | POA: Diagnosis not present

## 2024-01-29 DIAGNOSIS — E538 Deficiency of other specified B group vitamins: Secondary | ICD-10-CM | POA: Insufficient documentation

## 2024-01-29 DIAGNOSIS — Z1231 Encounter for screening mammogram for malignant neoplasm of breast: Secondary | ICD-10-CM

## 2024-01-29 DIAGNOSIS — Z124 Encounter for screening for malignant neoplasm of cervix: Secondary | ICD-10-CM

## 2024-01-29 DIAGNOSIS — Z0000 Encounter for general adult medical examination without abnormal findings: Secondary | ICD-10-CM | POA: Diagnosis not present

## 2024-01-29 DIAGNOSIS — K219 Gastro-esophageal reflux disease without esophagitis: Secondary | ICD-10-CM | POA: Insufficient documentation

## 2024-01-29 NOTE — Assessment & Plan Note (Signed)
 Recommend core strengthening exercises Can use heat, nsaids, topicals as needed.

## 2024-01-29 NOTE — Assessment & Plan Note (Signed)
 Continue daily oral supplementation.

## 2024-01-29 NOTE — Assessment & Plan Note (Signed)
 Mild recurrent symptoms Reflux symptoms are controlled on prn TUMS. Patient denies red flag symptoms - no melena, weight loss, dysphagia.

## 2024-03-28 ENCOUNTER — Ambulatory Visit
Admission: EM | Admit: 2024-03-28 | Discharge: 2024-03-28 | Disposition: A | Discharge status: Home / Self Care | Attending: Emergency Medicine | Admitting: Emergency Medicine

## 2024-03-28 ENCOUNTER — Encounter: Payer: Self-pay | Admitting: Emergency Medicine

## 2024-03-28 DIAGNOSIS — S61111A Laceration without foreign body of right thumb with damage to nail, initial encounter: Secondary | ICD-10-CM | POA: Diagnosis not present

## 2024-03-28 NOTE — ED Triage Notes (Signed)
Laceration to right thumb 

## 2024-03-28 NOTE — Discharge Instructions (Signed)
 Today you have been evaluated for your laceration in which a chunk of your thumb and nail have been removed  Able to stop bleeding using tissue adhesive which will naturally fall off with time, do not pick or peel  Compression dressing has been applied to further help stop bleeding, may remove in the morning  Once removed cleanse daily with unscented soap and water, pat and do not rub, may apply topical antibiotic ointment per preference  While there is no low risk for infection it may occur and if you begin to experience redness, swelling, puslike drainage or increased pain please return for reevaluation  May leave open to air however if at risk for becoming dirty based on your activity please cover with a nonstick bandage  Take ibuprofen 600 to 800 mg and/or Tylenol 500 to 1000 mg consistently for the next 24 hours to help reduce inflammation and help reduce pain  If covered with a Band-Aid may apply ice over the affected area as needed  Please follow-up with urgent care or your primary doctor for any concerns regarding healing  You do not need a tetanus as kitchen utensils are considered to be clean

## 2024-03-28 NOTE — ED Provider Notes (Signed)
 [facility]    CSN: [id number] Arrival date & time: 03/28/24  1855      History   Chief Complaint Chief Complaint  Patient presents with   Laceration    HPI Lydia Munoz is a 43 y.o. female.   Patient presents for evaluation of a laceration to the right thumb that occurred within the hour, cut by mandolin while cooking.  Currently bleeding.  Past Medical History:  Diagnosis Date   Closed fracture of left distal radius 12/13/2017    Patient Active Problem List   Diagnosis Date Noted   B12 deficiency 01/29/2024   Low back pain without sciatica 01/29/2024   Gastroesophageal reflux disease without esophagitis 01/29/2024   IUD (intrauterine device) in place 10/30/2021   Bilateral hearing loss 10/30/2021    Past Surgical History:  Procedure Laterality Date   CESAREAN SECTION  2012   CESAREAN SECTION  2015   WISDOM TOOTH EXTRACTION Bilateral     OB History     Gravida  3   Para  2   Term  2   Preterm      AB  1   Living  2      SAB  1   IAB      Ectopic      Multiple      Live Births               Home Medications    Prior to Admission medications   Medication Sig Start Date End Date Taking? Authorizing Provider  Multiple Vitamins-Minerals (MULTIVITAMIN WITH MINERALS) tablet Take 1 tablet by mouth daily.    [provider]    Family History Family History  Problem Relation Age of Onset   Hyperlipidemia Mother    Anxiety disorder Sister    Depression Sister    Colon cancer Maternal Grandmother    Hyperlipidemia Maternal Grandmother    Depression Maternal Grandfather    Heart disease Paternal Grandfather    Breast cancer Neg Hx     Social History Social History   Tobacco Use   Smoking status: Never   Smokeless tobacco: Never  Vaping Use   Vaping status: Never Used  Substance Use Topics   Alcohol use: Yes    Alcohol/week: 4.0 standard drinks of alcohol    Types: 4 Standard drinks or equivalent per week    Drug use: Never     Allergies   Penicillins   Review of Systems Review of Systems   Physical Exam Triage Vital Signs ED Triage Vitals  Encounter Vitals Group     BP 03/28/24 1906 127/83     Girls Systolic BP Percentile --      Girls Diastolic BP Percentile --      Boys Systolic BP Percentile --      Boys Diastolic BP Percentile --      Pulse Rate 03/28/24 1906 72     Resp 03/28/24 1906 18     Temp 03/28/24 1906 98 F (36.7 C)     Temp Source 03/28/24 1906 Oral     SpO2 03/28/24 1906 99 %     Weight --      Height --      Head Circumference --      Peak Flow --      Pain Score 03/28/24 1909 5     Pain Loc --      Pain Education --      Exclude from Growth Chart --  No data found.  Updated Vital Signs BP 127/83 (BP Location: Left Arm)   Pulse 72   Temp 98 F (36.7 C) (Oral)   Resp 18   LMP  (LMP Unknown)   SpO2 99%   Visual Acuity Right Eye Distance:   Left Eye Distance:   Bilateral Distance:    Right Eye Near:   Left Eye Near:    Bilateral Near:     Physical Exam Constitutional:      Appearance: Normal appearance.   Eyes:     Extraocular Movements: Extraocular movements intact.   Pulmonary:     Effort: Pulmonary effort is normal.   Skin:    Comments: 2 cm avulsion laceration affecting the lateral aspect of the right thumb with approximately 25% of the distal end of the nailbed removed,, sensation intact, capillary refill less than 3, has full range of motion, profusely bleeding   Neurological:     Mental Status: She is alert and oriented to person, place, and time. Mental status is at baseline.      UC Treatments / Results  Labs (all labs ordered are listed, but only abnormal results are displayed) Labs Reviewed - No data to display  EKG   Radiology No results found.  Procedures Laceration Repair  Date/Time: 03/28/2024 7:19 PM  Performed by: Teresa Shelba SAUNDERS, NP Authorized by: Teresa Shelba SAUNDERS, NP   Consent:    Consent  obtained:  Verbal   Consent given by:  Patient   Risks, benefits, and alternatives were discussed: yes     Risks discussed:  Pain and infection Universal protocol:    Procedure explained and questions answered to patient or proxy's satisfaction: yes     Patient identity confirmed:  Verbally with patient Anesthesia:    Anesthesia method:  None Laceration details:    Location:  Finger   Finger location:  R thumb   Length (cm):  2   Depth (mm):  0.5 Pre-procedure details:    Preparation:  Patient was prepped and draped in usual sterile fashion Exploration:    Hemostasis achieved with:  Tourniquet   Wound exploration: entire depth of wound visualized   Treatment:    Area cleansed with:  Chlorhexidine and saline   Amount of cleaning:  Standard   Irrigation method:  Tap Skin repair:    Repair method:  Tissue adhesive Approximation:    Approximation:  Close Post-procedure details:    Dressing:  Non-adherent dressing   Procedure completion:  Tolerated  (including critical care time)  Medications Ordered in UC Medications - No data to display  Initial Impression / Assessment and Plan / UC Course  I have reviewed the triage vital signs and the nursing notes.  Pertinent labs & imaging results that were available during my care of the patient were reviewed by me and considered in my medical decision making (see chart for details).  Laceration of right thumb without foreign body with damage to nail, initial encounter  Able to stop bleeding with use of tourniquet, sealed with tissue adhesive, advised to not pick or pull and allow to naturally fall off, compression dressing applied to be removed in the morning, recommended daily cleansing with unscented soap and water, may leave open to air or cover with Band-Aid if at risk for contamination, advised to monitor for infection and to return if becoming symptomatic, deferring tetanus as continued tensile offending agent, may follow-up with  urgent care or primary doctor for any delays in healing Final Clinical  Impressions(s) / UC Diagnoses   Final diagnoses:  Laceration of right thumb without foreign body with damage to nail, initial encounter     Discharge Instructions      Today you have been evaluated for your laceration in which a chunk of your thumb and nail have been removed  Able to stop bleeding using tissue adhesive which will naturally fall off with time, do not pick or peel  Compression dressing has been applied to further help stop bleeding, may remove in the morning  Once removed cleanse daily with unscented soap and water, pat and do not rub, may apply topical antibiotic ointment per preference  While there is no low risk for infection it may occur and if you begin to experience redness, swelling, puslike drainage or increased pain please return for reevaluation  May leave open to air however if at risk for becoming dirty based on your activity please cover with a nonstick bandage  Take ibuprofen 600 to 800 mg and/or Tylenol 500 to 1000 mg consistently for the next 24 hours to help reduce inflammation and help reduce pain  If covered with a Band-Aid may apply ice over the affected area as needed  Please follow-up with urgent care or your primary doctor for any concerns regarding healing  You do not need a tetanus as kitchen utensils are considered to be clean   ED Prescriptions   None    PDMP not reviewed this encounter.   Teresa Shelba SAUNDERS, NP 03/28/24 1919
